# Patient Record
Sex: Female | Born: 1965 | Race: White | Hispanic: No | Marital: Married | Smoking: Never smoker
Health system: Southern US, Community
[De-identification: ages and names within clinical notes are randomized; demographics above are authoritative.]

## PROBLEM LIST (undated history)

## (undated) DIAGNOSIS — Z8719 Personal history of other diseases of the digestive system: Secondary | ICD-10-CM

## (undated) DIAGNOSIS — R519 Headache, unspecified: Secondary | ICD-10-CM

## (undated) DIAGNOSIS — R51 Headache: Secondary | ICD-10-CM

## (undated) DIAGNOSIS — K219 Gastro-esophageal reflux disease without esophagitis: Secondary | ICD-10-CM

## (undated) HISTORY — DX: Headache: R51

## (undated) HISTORY — DX: Headache, unspecified: R51.9

## (undated) HISTORY — PX: BLADDER SUSPENSION: SHX72

## (undated) HISTORY — DX: Gastro-esophageal reflux disease without esophagitis: K21.9

---

## 1997-05-18 HISTORY — PX: EYE SURGERY: SHX253

## 1998-08-26 ENCOUNTER — Other Ambulatory Visit: Admission: RE | Admit: 1998-08-26 | Discharge: 1998-08-26 | Payer: Self-pay | Admitting: Obstetrics and Gynecology

## 1999-03-24 ENCOUNTER — Other Ambulatory Visit: Admission: RE | Admit: 1999-03-24 | Discharge: 1999-03-24 | Payer: Self-pay | Admitting: Obstetrics and Gynecology

## 1999-07-18 ENCOUNTER — Inpatient Hospital Stay (HOSPITAL_COMMUNITY): Admission: AD | Admit: 1999-07-18 | Discharge: 1999-07-18 | Payer: Self-pay | Admitting: Obstetrics and Gynecology

## 1999-10-03 ENCOUNTER — Inpatient Hospital Stay (HOSPITAL_COMMUNITY): Admission: AD | Admit: 1999-10-03 | Discharge: 1999-10-05 | Payer: Self-pay | Admitting: Obstetrics and Gynecology

## 1999-11-11 ENCOUNTER — Other Ambulatory Visit: Admission: RE | Admit: 1999-11-11 | Discharge: 1999-11-11 | Payer: Self-pay | Admitting: Obstetrics and Gynecology

## 2000-12-07 ENCOUNTER — Other Ambulatory Visit: Admission: RE | Admit: 2000-12-07 | Discharge: 2000-12-07 | Payer: Self-pay | Admitting: Obstetrics and Gynecology

## 2001-01-06 ENCOUNTER — Encounter: Admission: RE | Admit: 2001-01-06 | Discharge: 2001-01-06 | Payer: Self-pay | Admitting: Family Medicine

## 2001-01-06 ENCOUNTER — Encounter: Payer: Self-pay | Admitting: Family Medicine

## 2001-12-12 ENCOUNTER — Other Ambulatory Visit: Admission: RE | Admit: 2001-12-12 | Discharge: 2001-12-12 | Payer: Self-pay | Admitting: Obstetrics and Gynecology

## 2002-01-27 ENCOUNTER — Encounter: Payer: Self-pay | Admitting: Urology

## 2002-01-27 ENCOUNTER — Encounter: Admission: RE | Admit: 2002-01-27 | Discharge: 2002-01-27 | Payer: Self-pay | Admitting: Urology

## 2003-01-03 ENCOUNTER — Other Ambulatory Visit: Admission: RE | Admit: 2003-01-03 | Discharge: 2003-01-03 | Payer: Self-pay | Admitting: Obstetrics and Gynecology

## 2003-05-19 HISTORY — PX: TUBAL LIGATION: SHX77

## 2003-07-31 ENCOUNTER — Ambulatory Visit (HOSPITAL_COMMUNITY): Admission: RE | Admit: 2003-07-31 | Discharge: 2003-07-31 | Payer: Self-pay | Admitting: Obstetrics and Gynecology

## 2004-02-08 ENCOUNTER — Other Ambulatory Visit: Admission: RE | Admit: 2004-02-08 | Discharge: 2004-02-08 | Payer: Self-pay | Admitting: Obstetrics and Gynecology

## 2004-12-11 ENCOUNTER — Encounter: Admission: RE | Admit: 2004-12-11 | Discharge: 2004-12-11 | Payer: Self-pay | Admitting: Family Medicine

## 2005-02-20 ENCOUNTER — Other Ambulatory Visit: Admission: RE | Admit: 2005-02-20 | Discharge: 2005-02-20 | Payer: Self-pay | Admitting: Obstetrics and Gynecology

## 2007-09-30 ENCOUNTER — Encounter: Admission: RE | Admit: 2007-09-30 | Discharge: 2007-09-30 | Payer: Self-pay | Admitting: Obstetrics and Gynecology

## 2008-02-24 ENCOUNTER — Encounter: Admission: RE | Admit: 2008-02-24 | Discharge: 2008-02-24 | Payer: Self-pay | Admitting: Family Medicine

## 2009-04-30 ENCOUNTER — Ambulatory Visit (HOSPITAL_BASED_OUTPATIENT_CLINIC_OR_DEPARTMENT_OTHER): Admission: RE | Admit: 2009-04-30 | Discharge: 2009-04-30 | Payer: Self-pay | Admitting: Urology

## 2010-05-18 HISTORY — PX: BREAST LUMPECTOMY: SHX2

## 2010-06-08 ENCOUNTER — Encounter: Payer: Self-pay | Admitting: Obstetrics and Gynecology

## 2010-08-19 LAB — POCT HEMOGLOBIN-HEMACUE: Hemoglobin: 12.5 g/dL (ref 12.0–15.0)

## 2010-10-03 NOTE — H&P (Signed)
NAME:  Deanna Carter, Deanna Carter                      ACCOUNT NO.:  [id number]   MEDICAL RECORD NO.:  [id number]                   PATIENT TYPE:  AMB   LOCATION:  DAY                                  FACILITY:  [facility]   PHYSICIAN:  Juluis Mire, M.D.                DATE OF BIRTH:  Feb 07, 1966   DATE OF ADMISSION:  07/31/2003  DATE OF DISCHARGE:                                HISTORY & PHYSICAL   HISTORY OF PRESENT ILLNESS:  The patient is a 45 year old gravida 3, para 2,  abortus 1, married white female who presents for permanent sterilization.  Also under evaluation by Dr. Logan Bores for stress incontinence.   In relation to the present admission, the patient is presently on birth  control pills in the form of ________.  She is desirous of permanent  sterilization.  The potential irreversibility of sterilization is explained.  Failure rate of 1 in 200 is quoted.  Failures can be in the form of ectopic  pregnancy requiring further surgical management.  The patient voiced  understanding of alternatives, still desires permanent sterilization.  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  We will have laser stand-by available.   ALLERGIES:  No known drug allergies.   MEDICATIONS:  1. Birth control pills in the form of ______________.  2. Meclizine.   PAST MEDICAL HISTORY:  Usual childhood diseases with no significant sequela.   PAST SURGICAL HISTORY:  The noted previous diagnostic laparoscopy with laser  stand-by in 1988.   OBSTETRICAL HISTORY:  She has had two vaginal deliveries, one TAB.   FAMILY HISTORY:  Noncontributory.   SOCIAL HISTORY:  No tobacco or alcohol use.   REVIEW OF SYSTEMS:  Noncontributory.   PHYSICAL EXAMINATION:  VITAL SIGNS:  The patient is afebrile with stable  vital signs.  HEENT:  The patient is normocephalic.  Pupils equal, round, reactive to  light and accommodation.  Extraocular movements were intact.  Sclerae and  conjunctivae clear.  Oropharynx clear.  NECK:  Without thyromegaly.  BREASTS:  No discrete masses.  LUNGS:  Clear.  HEART:  Regular rate and rhythm without murmurs or gallops.  ABDOMEN:  Benign.  No masses, organomegaly, or tenderness.  PELVIC:  Normal external genitalia.  Vaginal mucosa is clear.  Cervix is  unremarkable.  Uterus is normal size, shape, and contour.  Adnexa free of  masses or tenderness.  EXTREMITIES:  Trace edema.  NEUROLOGIC:  Grossly within normal limits.   IMPRESSION:  Multiparity, desires sterility.   PLAN:  The patient will undergo diagnostic laparoscopy with laser stand-by.  We will do a bilateral tubal fulguration.  The risks of surgery have been  discussed, including the risk of infection.  The risk of hemorrhage that  could require transfusion with associated risks of AIDS or hepatitis.  The  risk of injury to adjacent organs, including bowel, bladder, ureters, that  could require further exploratory surgery.  The risk of deep vein thrombosis  and pulmonary embolism.  The patient voiced understanding of the indications  and risks.                                               Juluis Mire, M.D.    JSM/MEDQ  D:  07/31/2003  T:  07/31/2003  Job:  [id number]

## 2010-10-03 NOTE — Op Note (Signed)
NAME:  Deanna Carter, Deanna Carter                      ACCOUNT NO.:  [id number]   MEDICAL RECORD NO.:  [id number]                   PATIENT TYPE:  AMB   LOCATION:  DAY                                  FACILITY:  [facility]   PHYSICIAN:  Juluis Mire, M.D.                DATE OF BIRTH:  November 05, 1965   DATE OF PROCEDURE:  07/31/2003  DATE OF DISCHARGE:                                 OPERATIVE REPORT   PREOPERATIVE DIAGNOSES:  1. Multiparity, desires sterility.  2. History of endometriosis.   POSTOPERATIVE DIAGNOSES:  1. Multiparity, desires sterility.  2. History of endometriosis.  3. No evidence of active endometriosis.  4. Some evidence of pelvic congestion.   PROCEDURE:  Diagnostic laparoscopic with bilateral tubal fulguration.   SURGEON:  Juluis Mire, M.D.   ANESTHESIA:  General endotracheal.   ESTIMATED BLOOD LOSS:  Minimal.   PACKS AND DRAINS:  None.   INTRAOPERATIVE BLOOD REPLACEMENT:  None.   COMPLICATIONS:  None.   INDICATIONS:  Dictated in history and physical.   DESCRIPTION OF PROCEDURE:  The patient was taken to the OR and placed in  supine position.  After satisfactory level of general endotracheal  anesthesia was obtained, the patient was placed in the dorsal lithotomy  position using the Allen stirrups.  PAS stockings were in place.  The  abdomen, perineum, and vagina were prepped out with Betadine.  Bladder was  emptied by catheterization.  Exam under anesthesia revealed a mild  cystocele; uterus normal size, shape, and contour; adnexa unremarkable.  A  Hulka tenaculum was put in place and secured.  The patient was draped out as  a sterile field.  Subumbilical incision was made with the knife.  The Veress  needle was introduced in the abdominal cavity.  The abdomen was inflated  with approximately 3 liters of carbon dioxide.  The operating laparoscope  was introduced.  There was no evidence of injury to adjacent organs.  Upper  abdomen, including the liver and  tip of the gallbladder, were clear.  We  could visualize the cecum.  But despite manipulation, we could not see the  appendix, felt that it was probably retrocecal.  There were no adhesions or  signs of concerns.  Uterus was of normal size and shape.  Tubes and ovaries  were unremarkable.  She had large venous congestion on the sides of the  uterus in the right ovarian area.  But, again, no signs of endometriosis or  adhesions.  Both tubes were identified by following out to fimbriated end.  Bipolar was used to cauterize a 2-3 cm segment of tube.  Cauterization was  continued until resistance read zero on the voltage meter.  We then re-  cauterized the same segment to the tube, completely desiccating the tube.  Both tubes were adequately coagulated.  At the end of the procedure, there  were no signs of injury to adjacent organs or active  bleeding.  Both tubes  were adequately cauterized.  The abdomen was deflated with carbon dioxide.  The laparoscope and trocar was removed.  The subumbilical incision was  closed with interrupted subcuticulars of 4-0 Vicryl.  The Hulka tenaculum  was then removed.  Sponge and instrument count reported as correct by  circulating nurse.  Dr. Logan Bores took over at this point in time to complete  the operative procedure.                                               Juluis Mire, M.D.    JSM/MEDQ  D:  07/31/2003  T:  07/31/2003  Job:  [id number]

## 2010-10-03 NOTE — Op Note (Signed)
NAME:  Deanna Carter, Deanna Carter                      ACCOUNT NO.:  [id number]   MEDICAL RECORD NO.:  [id number]                   PATIENT TYPE:  AMB   LOCATION:  DAY                                  FACILITY:  [facility]   PHYSICIAN:  Jamison Neighbor, M.D.               DATE OF BIRTH:  1965-11-15   DATE OF PROCEDURE:  07/31/2003  DATE OF DISCHARGE:                                 OPERATIVE REPORT   PREOPERATIVE DIAGNOSES:  1. Stress urinary incontinence.  2. Cystocele.   POSTOPERATIVE DIAGNOSES:  1. Stress urinary incontinence.  2. Cystocele.   PROCEDURE:  1. Cystoscopy.  2. Anterior repair of cystocele.  3. Pubovaginal sling.   SURGEON:  Jamison Neighbor, M.D.   ANESTHESIA:  General.   COMPLICATIONS:  None.   DRAINS:  A 16 French Foley catheter.   BRIEF HISTORY:  This 45 year old female has classic stress urinary  incontinence.  She has loss of urine whenever she does physical activity  such as jumping, __________, dancing, etc.  The patient is scheduled to have  a tubal ligation and was interested in having a sling procedure done at the  same time.  At the time of her initial evaluation, we did note that she had  a cystocele and a somewhat elevated postvoid residual, and it was thought  that the cystocele may need to be reduced at the time of surgery.  However,  we did think that we could possibly treat this with a simple sling.  The  patient is to undergo the tubal ligation with Dr. Arelia Sneddon and then will  undergo sling procedure with possible cystocele depending on the  intraoperative findings.  The patient understands the risks and benefits of  the procedure and gave full informed consent.   DESCRIPTION OF PROCEDURE:  After successful induction of general anesthesia,  the patient was placed in the dorsal lithotomy position, prepped with  Betadine, and draped in the usual sterile fashion.  The patient underwent a  tubal ligation by Dr. Arelia Sneddon with fulguration of the fallopian  tubes  bilaterally.  The patient was then re-prepped and draped for the sling  procedure.   The weighted speculum was placed, and the area was inspected.  The patient  did not have much in the way of a rectocele, and her perineum seemed to be  intact, but she did have larger cystocele that had been appreciated  preoperatively.  There was some degree of uterine prolapse but certainly not  enough in Dr. Lisbeth Ply opinion to require hysterectomy.  The decision was  made to perform the sling as well as to do a few simple plications such as  to somewhat reduce the cystocele and improve her ability to empty her  bladder postoperatively.  The area was infiltrated with local anesthesia,  and an incision was made, beginning at the mid urethra extending back  towards the cardinal ligaments.  The flaps of mucosa were  raised  bilaterally, hemostasis obtained with electrocautery.  Entry into the space  of Retzius was performed at the major urethral level for placement of the  sling.  The patient appeared to primarily have a central defect without a  lot of lateral defect, and it was felt that mesh repair was not required.  The patient had the cystocele reduced with several mattress sutures of 2-0  Vicryl which did nicely elevated and correct the central defect.  The  patient had two incisions made 3-4 cm lateral to the clitoris at the groin  crease, and the needle was then passed through the obturator and into the  retroperitoneal space.  The sling was then positioned directly onto the  urethra, and the tension was set appropriately.  Cystoscopy was performed,  first with the 70-degree lens and then with the 12-degree lens.  This was  done to ensure that there was no injury to the bladder, and it was clear  that the bladder had not been entered in any way.  In addition, the vaginal  area was carefully inspected to make sure that the needle had not passed  through the vaginal mucosa which was intact.   The vaginal mucosa was then  trimmed slightly and was closed with a running suture of 3-0 Vicryl.  The  patient had had the sling tension set through both inspection as well as by  filling the bladder and performing a Valsalva maneuver to ensure that she  had a prompt straight flow of urine.  The cystoscope entered directly, and  there was no excessive angulation.  The patient had packing applied.  The  Foley catheter was left in place.  The two small stab incisions in the groin  crease were closed with Dermabond.  The patient tolerated the procedure well  and was taken to the recovery room in good condition.  She originally was  set to have this done as an outpatient, but she will be given the option of  23 hour observation if she prefers because she did have the additional  cystocele repair.  She also may have the option of going home with a  catheter if unable to urinate.  The patient will be given a prescription for  Lorcet 10 as well as Keflex and return to the office in follow-up.                                               Jamison Neighbor, M.D.    RJE/MEDQ  D:  07/31/2003  T:  07/31/2003  Job:  [id number]   cc:   Juluis Mire, M.D.  [address] [place]  [place] [address]  Fax: [phone]   Quita Skye. Artis Flock, M.D.  [address], [address]  [place]  [place] [address]  Fax: [phone]

## 2010-10-29 ENCOUNTER — Other Ambulatory Visit: Payer: Self-pay | Admitting: Otolaryngology

## 2010-10-29 DIAGNOSIS — H938X9 Other specified disorders of ear, unspecified ear: Secondary | ICD-10-CM

## 2010-10-29 DIAGNOSIS — R42 Dizziness and giddiness: Secondary | ICD-10-CM

## 2010-11-03 ENCOUNTER — Ambulatory Visit
Admission: RE | Admit: 2010-11-03 | Discharge: 2010-11-03 | Disposition: A | Discharge status: Home / Self Care | Payer: BC Managed Care – PPO | Source: Ambulatory Visit | Attending: Otolaryngology | Admitting: Otolaryngology

## 2010-11-03 DIAGNOSIS — H938X9 Other specified disorders of ear, unspecified ear: Secondary | ICD-10-CM

## 2010-11-03 DIAGNOSIS — R42 Dizziness and giddiness: Secondary | ICD-10-CM

## 2010-11-03 MED ORDER — GADOBENATE DIMEGLUMINE 529 MG/ML IV SOLN
14.0000 mL | Freq: Once | INTRAVENOUS | Status: AC | PRN
  Administered 2010-11-03: 14 mL via INTRAVENOUS

## 2011-02-09 ENCOUNTER — Other Ambulatory Visit: Payer: Self-pay | Admitting: Obstetrics and Gynecology

## 2011-02-09 DIAGNOSIS — N63 Unspecified lump in unspecified breast: Secondary | ICD-10-CM

## 2011-02-17 ENCOUNTER — Other Ambulatory Visit: Payer: Self-pay | Admitting: Obstetrics and Gynecology

## 2011-02-17 ENCOUNTER — Ambulatory Visit
Admission: RE | Admit: 2011-02-17 | Discharge: 2011-02-17 | Disposition: A | Discharge status: Home / Self Care | Payer: BC Managed Care – PPO | Source: Ambulatory Visit | Attending: Obstetrics and Gynecology | Admitting: Obstetrics and Gynecology

## 2011-02-17 DIAGNOSIS — N63 Unspecified lump in unspecified breast: Secondary | ICD-10-CM

## 2011-03-02 ENCOUNTER — Ambulatory Visit (INDEPENDENT_AMBULATORY_CARE_PROVIDER_SITE_OTHER): Payer: BC Managed Care – PPO | Admitting: Surgery

## 2011-03-02 ENCOUNTER — Encounter (INDEPENDENT_AMBULATORY_CARE_PROVIDER_SITE_OTHER): Payer: Self-pay | Admitting: Surgery

## 2011-03-02 VITALS — BP 124/90 | HR 62 | Temp 98.0°F | Resp 14 | Ht 67.5 in | Wt 157.2 lb

## 2011-03-02 DIAGNOSIS — N63 Unspecified lump in unspecified breast: Secondary | ICD-10-CM

## 2011-03-02 DIAGNOSIS — N6310 Unspecified lump in the right breast, unspecified quadrant: Secondary | ICD-10-CM

## 2011-03-02 NOTE — Progress Notes (Signed)
Chief Complaint  Patient presents with  . 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 female.   HPI Patient referred by Dr. Guinevere Ferrari for evaluation of a right breast mass. The patient palpated the mass herself. She then had mammograms and ultrasound demonstrating a right breast mass. She is here after core biopsy of the mass. She has no complaints regarding her breast. She denies nipple discharge. Her family history is negative for breast cancer Past Medical History  Diagnosis Date  . GERD (gastroesophageal reflux disease)   . Generalized headaches     tension - only takes medication as needed to treat    Past Surgical History  Procedure Date  . Bladder suspension 2009  . Tubal ligation 2005    History reviewed. No pertinent family history.  Social History History  Substance Use Topics  . Smoking status: Never Smoker   . Smokeless tobacco: Never Used  . Alcohol Use: No    No Known Allergies  Current Outpatient Prescriptions  Medication Sig Dispense Refill  . Hydrocodone-Guaifenesin (NARCOF PO) Take 325 mg by mouth as needed.        Lorita Officer Triphasic (ORTHO TRI-CYCLEN, 28, PO) Take by mouth daily. Pt taking the generic version of this medication.       Marland Kitchen zolpidem (AMBIEN) 10 MG tablet Take 10 mg by mouth as needed.          Review of Systems Review of Systems  Constitutional: Negative.   HENT: Negative.   Eyes: Negative.   Cardiovascular: Negative.   Gastrointestinal: Negative.   Genitourinary: Negative.   Musculoskeletal: Negative.   Neurological: Negative.   Hematological: Negative.   Psychiatric/Behavioral: Negative.     Blood pressure 124/90, pulse 62, temperature 98 F (36.7 C), temperature source Temporal, resp. rate 14, height 5' 7.5" (1.715 m), weight 157 lb 3.2 oz (71.305 kg).  Physical Exam Physical Exam  Constitutional: She is oriented to person, place, and time. She appears well-developed and  well-nourished. No distress.  HENT:  Head: Normocephalic and atraumatic.  Right Ear: External ear normal.  Left Ear: External ear normal.  Nose: Nose normal.  Mouth/Throat: Oropharynx is clear and moist. No oropharyngeal exudate.  Eyes: Conjunctivae are normal. Pupils are equal, round, and reactive to light. No scleral icterus.  Neck: Normal range of motion. Neck supple. No JVD present. No tracheal deviation present. No thyromegaly present.  Cardiovascular: Normal rate, regular rhythm and normal heart sounds.   No murmur heard. Pulmonary/Chest: Effort normal and breath sounds normal. No respiratory distress. She has no wheezes.  Abdominal: Soft. Bowel sounds are normal. There is no tenderness.  Musculoskeletal: Normal range of motion. She exhibits no edema and no tenderness.  Lymphadenopathy:    She has no cervical adenopathy.  Neurological: She is alert and oriented to person, place, and time.  Skin: Skin is warm and dry. No rash noted. She is not diaphoretic. No erythema.  Psychiatric: Her behavior is normal. Judgment normal.  On breast exam, there is ecchymosis of the right breast and the biopsy site. There is so much swelling and hematoma that I cannot palpate a breast mass. There is no right axillary adenopathy  Data Reviewed I have affixed mammogram and ultrasound of the breast which I reviewed. 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 As I cannot palpate a mass, needle localization would be needed for needle localized lumpectomy. I discussed the risks of surgery with her including the risk of bleeding infection injury, and a chest finding malignancy would need for further surgery. She understands and wished to proceed. She will call to schedule surgery. I told her that it was very likely that the mass be removed and  hopefully findings will be benign       [name] A 03/02/2011, 2:17 PM

## 2011-03-06 ENCOUNTER — Other Ambulatory Visit (INDEPENDENT_AMBULATORY_CARE_PROVIDER_SITE_OTHER): Payer: Self-pay | Admitting: Surgery

## 2011-03-06 DIAGNOSIS — N63 Unspecified lump in unspecified breast: Secondary | ICD-10-CM

## 2011-03-18 ENCOUNTER — Encounter (HOSPITAL_COMMUNITY): Payer: Self-pay | Admitting: Pharmacist

## 2011-03-19 ENCOUNTER — Encounter (HOSPITAL_COMMUNITY): Payer: Self-pay | Admitting: Pharmacy Technician

## 2011-03-20 ENCOUNTER — Encounter (HOSPITAL_COMMUNITY)
Admission: RE | Admit: 2011-03-20 | Discharge: 2011-03-20 | Disposition: A | Discharge status: Home / Self Care | Payer: BC Managed Care – PPO | Source: Ambulatory Visit | Attending: Surgery | Admitting: Surgery

## 2011-03-20 ENCOUNTER — Encounter (HOSPITAL_COMMUNITY): Payer: Self-pay

## 2011-03-20 LAB — CBC
MCHC: 34.2 g/dL (ref 30.0–36.0)
RDW: 12.4 % (ref 11.5–15.5)

## 2011-03-20 LAB — SURGICAL PCR SCREEN
MRSA, PCR: NEGATIVE
Staphylococcus aureus: NEGATIVE

## 2011-03-20 LAB — HCG, SERUM, QUALITATIVE: Preg, Serum: NEGATIVE

## 2011-03-20 NOTE — Pre-Procedure Instructions (Signed)
20 Deanna Carter  03/20/2011   Your procedure is scheduled on:  November 8  Report to [facility] at 7:30 AM.  Call this number if you have problems the morning of surgery: [phone]   Remember:   Do not eat food:After Midnight.  Do not drink clear liquids: 4 Hours before arrival.  Take these medicines the morning of surgery with A SIP OF WATER: Tylenol if needed, hydrocodone if needed, eye drops, birth control, omeprazole   Do not wear jewelry, make-up or nail polish.  Do not wear lotions, powders, or perfumes. You may wear deodorant.  Do not shave 48 hours prior to surgery.  Do not bring valuables to the hospital.  Contacts, dentures or bridgework may not be worn into surgery.  Leave suitcase in the car. After surgery it may be brought to your room.  For patients admitted to the hospital, checkout time is 11:00 AM the day of discharge.   Patients discharged the day of surgery will not be allowed to drive home.  Name and phone number of your driver: Genevie Cheshire [phone]  Special Instructions: CHG Shower Use Special Wash: 1/2 bottle night before surgery and 1/2 bottle morning of surgery.   Please read over the following fact sheets that you were given: Pain Booklet, Coughing and Deep Breathing and Surgical Site Infection Prevention

## 2011-03-25 MED ORDER — CEFAZOLIN SODIUM-DEXTROSE 2-3 GM-% IV SOLR
2.0000 g | INTRAVENOUS | Status: DC
  Filled 2011-03-25: qty 50

## 2011-03-26 ENCOUNTER — Ambulatory Visit (HOSPITAL_COMMUNITY): Payer: BC Managed Care – PPO | Admitting: Anesthesiology

## 2011-03-26 ENCOUNTER — Encounter (HOSPITAL_COMMUNITY)
Admission: RE | Disposition: A | Discharge status: Home / Self Care | Payer: Self-pay | Source: Ambulatory Visit | Attending: Surgery

## 2011-03-26 ENCOUNTER — Encounter (HOSPITAL_COMMUNITY): Payer: Self-pay | Admitting: Anesthesiology

## 2011-03-26 ENCOUNTER — Ambulatory Visit
Admission: RE | Admit: 2011-03-26 | Discharge: 2011-03-26 | Disposition: A | Discharge status: Home / Self Care | Payer: BC Managed Care – PPO | Source: Ambulatory Visit | Attending: Surgery | Admitting: Surgery

## 2011-03-26 ENCOUNTER — Other Ambulatory Visit (INDEPENDENT_AMBULATORY_CARE_PROVIDER_SITE_OTHER): Payer: Self-pay | Admitting: Surgery

## 2011-03-26 ENCOUNTER — Ambulatory Visit (HOSPITAL_COMMUNITY)
Admission: RE | Admit: 2011-03-26 | Discharge: 2011-03-26 | Disposition: A | Discharge status: Home / Self Care | Payer: BC Managed Care – PPO | Source: Ambulatory Visit | Attending: Surgery | Admitting: Surgery

## 2011-03-26 DIAGNOSIS — Z01812 Encounter for preprocedural laboratory examination: Secondary | ICD-10-CM | POA: Insufficient documentation

## 2011-03-26 DIAGNOSIS — D249 Benign neoplasm of unspecified breast: Secondary | ICD-10-CM

## 2011-03-26 DIAGNOSIS — N63 Unspecified lump in unspecified breast: Secondary | ICD-10-CM

## 2011-03-26 DIAGNOSIS — R51 Headache: Secondary | ICD-10-CM | POA: Insufficient documentation

## 2011-03-26 DIAGNOSIS — N6089 Other benign mammary dysplasias of unspecified breast: Secondary | ICD-10-CM | POA: Insufficient documentation

## 2011-03-26 DIAGNOSIS — N6039 Fibrosclerosis of unspecified breast: Secondary | ICD-10-CM | POA: Insufficient documentation

## 2011-03-26 DIAGNOSIS — K219 Gastro-esophageal reflux disease without esophagitis: Secondary | ICD-10-CM | POA: Insufficient documentation

## 2011-03-26 DIAGNOSIS — N6310 Unspecified lump in the right breast, unspecified quadrant: Secondary | ICD-10-CM | POA: Insufficient documentation

## 2011-03-26 HISTORY — PX: MASTECTOMY, PARTIAL: SHX709

## 2011-03-26 HISTORY — PX: BREAST SURGERY: SHX581

## 2011-03-26 SURGERY — MASTECTOMY PARTIAL
Anesthesia: Monitor Anesthesia Care | Site: Breast | Laterality: Right | Wound class: Clean

## 2011-03-26 MED ORDER — FENTANYL CITRATE 0.05 MG/ML IJ SOLN
INTRAMUSCULAR | Status: DC | PRN
  Administered 2011-03-26 (×2): 50 ug via INTRAVENOUS

## 2011-03-26 MED ORDER — HYDROCODONE-ACETAMINOPHEN 5-325 MG PO TABS: 1.0000 | ORAL_TABLET | ORAL | Status: AC | PRN

## 2011-03-26 MED ORDER — SODIUM CHLORIDE 0.9 % IR SOLN
Status: DC | PRN
  Administered 2011-03-26: 1

## 2011-03-26 MED ORDER — MEPERIDINE HCL 25 MG/ML IJ SOLN: 6.2500 mg | INTRAMUSCULAR | Status: DC | PRN

## 2011-03-26 MED ORDER — KETOROLAC TROMETHAMINE 30 MG/ML IJ SOLN
INTRAMUSCULAR | Status: DC | PRN
  Administered 2011-03-26: 30 mg via INTRAVENOUS

## 2011-03-26 MED ORDER — CEFAZOLIN SODIUM 1-5 GM-% IV SOLN
INTRAVENOUS | Status: DC | PRN
  Administered 2011-03-26: 2 g via INTRAVENOUS

## 2011-03-26 MED ORDER — MIDAZOLAM HCL 2 MG/2ML IJ SOLN: 0.5000 mg | Freq: Once | INTRAMUSCULAR | Status: DC | PRN

## 2011-03-26 MED ORDER — OXYCODONE HCL 5 MG PO TABS: 5.0000 mg | ORAL_TABLET | ORAL | Status: DC | PRN

## 2011-03-26 MED ORDER — ONDANSETRON HCL 4 MG/2ML IJ SOLN
INTRAMUSCULAR | Status: DC | PRN
  Administered 2011-03-26: 4 mg via INTRAVENOUS

## 2011-03-26 MED ORDER — LACTATED RINGERS IV SOLN
INTRAVENOUS | Status: DC
  Administered 2011-03-26: 09:00:00 via INTRAVENOUS

## 2011-03-26 MED ORDER — PROMETHAZINE HCL 25 MG/ML IJ SOLN: 12.5000 mg | Freq: Four times a day (QID) | INTRAMUSCULAR | Status: DC | PRN

## 2011-03-26 MED ORDER — HYDROMORPHONE HCL PF 1 MG/ML IJ SOLN: 0.2500 mg | INTRAMUSCULAR | Status: DC | PRN

## 2011-03-26 MED ORDER — MORPHINE SULFATE 2 MG/ML IJ SOLN: 0.0500 mg/kg | INTRAMUSCULAR | Status: DC | PRN

## 2011-03-26 MED ORDER — MIDAZOLAM HCL 5 MG/5ML IJ SOLN
INTRAMUSCULAR | Status: DC | PRN
  Administered 2011-03-26: 2 mg via INTRAVENOUS

## 2011-03-26 MED ORDER — LACTATED RINGERS IV SOLN
INTRAVENOUS | Status: DC | PRN
  Administered 2011-03-26: 09:00:00 via INTRAVENOUS

## 2011-03-26 MED ORDER — SCOPOLAMINE 1 MG/3DAYS TD PT72
MEDICATED_PATCH | TRANSDERMAL | Status: DC | PRN
  Administered 2011-03-26: 1.5 mg via TRANSDERMAL

## 2011-03-26 MED ORDER — PROPOFOL 10 MG/ML IV EMUL
INTRAVENOUS | Status: DC | PRN
  Administered 2011-03-26: 160 mg via INTRAVENOUS

## 2011-03-26 MED ORDER — PROMETHAZINE HCL 25 MG/ML IJ SOLN: 6.2500 mg | INTRAMUSCULAR | Status: DC | PRN

## 2011-03-26 MED ORDER — DEXAMETHASONE SODIUM PHOSPHATE 4 MG/ML IJ SOLN
INTRAMUSCULAR | Status: DC | PRN
  Administered 2011-03-26: 4 mg via INTRAVENOUS

## 2011-03-26 MED ORDER — BUPIVACAINE-EPINEPHRINE 0.25% -1:200000 IJ SOLN
INTRAMUSCULAR | Status: DC | PRN
  Administered 2011-03-26: 10 mL

## 2011-03-26 SURGICAL SUPPLY — 40 items
ADH SKN CLS APL DERMABOND .7 (GAUZE/BANDAGES/DRESSINGS) ×1
BLADE SURG 15 STRL LF DISP TIS (BLADE) ×1 IMPLANT
BLADE SURG 15 STRL SS (BLADE) ×2
CANISTER SUCTION 2500CC (MISCELLANEOUS) ×1 IMPLANT
CHLORAPREP W/TINT 26ML (MISCELLANEOUS) ×2 IMPLANT
CLOTH BEACON ORANGE TIMEOUT ST (SAFETY) ×2 IMPLANT
COVER SURGICAL LIGHT HANDLE (MISCELLANEOUS) ×2 IMPLANT
DECANTER SPIKE VIAL GLASS SM (MISCELLANEOUS) ×2 IMPLANT
DERMABOND ADVANCED (GAUZE/BANDAGES/DRESSINGS) ×1
DERMABOND ADVANCED .7 DNX12 (GAUZE/BANDAGES/DRESSINGS) ×1 IMPLANT
DRAPE PED LAPAROTOMY (DRAPES) ×2 IMPLANT
ELECT CAUTERY BLADE 6.4 (BLADE) ×2 IMPLANT
ELECT REM PT RETURN 9FT ADLT (ELECTROSURGICAL) ×2
ELECTRODE REM PT RTRN 9FT ADLT (ELECTROSURGICAL) ×1 IMPLANT
GAUZE SPONGE 4X4 16PLY XRAY LF (GAUZE/BANDAGES/DRESSINGS) ×2 IMPLANT
GLOVE BIOGEL PI IND STRL 7.0 (GLOVE) IMPLANT
GLOVE BIOGEL PI INDICATOR 7.0 (GLOVE) ×1
GLOVE SURG SIGNA 7.5 PF LTX (GLOVE) ×2 IMPLANT
GLOVE SURG SS PI 6.5 STRL IVOR (GLOVE) ×1 IMPLANT
GOWN BRE IMP SLV AUR LG STRL (GOWN DISPOSABLE) ×1 IMPLANT
GOWN PREVENTION PLUS XLARGE (GOWN DISPOSABLE) ×4 IMPLANT
GOWN STRL NON-REIN LRG LVL3 (GOWN DISPOSABLE) ×2 IMPLANT
KIT BASIN OR (CUSTOM PROCEDURE TRAY) ×2 IMPLANT
KIT ROOM TURNOVER OR (KITS) ×2 IMPLANT
NDL HYPO 25GX1X1/2 BEV (NEEDLE) ×1 IMPLANT
NEEDLE HYPO 25GX1X1/2 BEV (NEEDLE) ×2 IMPLANT
NS IRRIG 1000ML POUR BTL (IV SOLUTION) ×2 IMPLANT
PACK SURGICAL SETUP 50X90 (CUSTOM PROCEDURE TRAY) ×2 IMPLANT
PAD ARMBOARD 7.5X6 YLW CONV (MISCELLANEOUS) ×2 IMPLANT
PENCIL BUTTON HOLSTER BLD 10FT (ELECTRODE) ×2 IMPLANT
SUT MNCRL AB 4-0 PS2 18 (SUTURE) ×2 IMPLANT
SUT VIC AB 3-0 SH 27 (SUTURE) ×2
SUT VIC AB 3-0 SH 27X BRD (SUTURE) ×1 IMPLANT
SYR BULB 3OZ (MISCELLANEOUS) ×2 IMPLANT
SYR CONTROL 10ML LL (SYRINGE) ×2 IMPLANT
TOWEL OR 17X24 6PK STRL BLUE (TOWEL DISPOSABLE) ×2 IMPLANT
TOWEL OR 17X26 10 PK STRL BLUE (TOWEL DISPOSABLE) ×2 IMPLANT
TUBE CONNECTING 12X1/4 (SUCTIONS) ×1 IMPLANT
WATER STERILE IRR 1000ML POUR (IV SOLUTION) IMPLANT
YANKAUER SUCT BULB TIP NO VENT (SUCTIONS) ×1 IMPLANT

## 2011-03-26 NOTE — Op Note (Signed)
03/26/2011  10:11 AM  PATIENT:  Deanna Carter  45 y.o. female  PRE-OPERATIVE DIAGNOSIS:  RIGHT BREAST MASS  POST-OPERATIVE DIAGNOSIS:  right breast mass   PROCEDURE:  Procedure(s): MASTECTOMY PARTIAL  SURGEON:  Surgeon(s): Shelly Rubenstein, MD  PHYSICIAN ASSISTANT:   ASSISTANTS: none   ANESTHESIA:   local and general  EBL:     BLOOD ADMINISTERED:none  DRAINS: none   LOCAL MEDICATIONS USED:  MARCAINE 10CC  SPECIMEN:  Excision  DISPOSITION OF SPECIMEN:  PATHOLOGY  COUNTS:  YES  TOURNIQUET:  * No tourniquets in log *  DICTATION: .Dragon Dictation The patient was brought to the operating room and identified as the correct patient. She was placed supine on the operating room table and general anesthesia was induced. Her right breast was then prepped and draped in the usual sterile fashion. A localization wire had already been placed into the right breast. I dissected the skin at the lower edge of the areola Marcaine.  I then made a incision with a scalpel. I took this down into the breast tissue with the cautery. I then incorporated the localization wire into the incision. I then performed a wide excisional biopsy of the breast tissue surrounding the wire. Once the specimen was completely removed, x-ray confirmed that the suspicious specimen and clip were in the removed biopsy specimen. I then achieved hemostasis with cautery. The wound is irrigated with saline. The subcutaneous tissue was closed with 3-0 Vicryl sutures. The skin was closed with a running 4-0 Monocryl suture. Dermabond was then used. Gauze and tape were applied. The patient tolerated procedure well. All counts were correct at the end of the procedure. The patient was extubated in the operating room and taken in stable condition to the recovery area.   PLAN OF CARE: Discharge to home after PACU  PATIENT DISPOSITION:  PACU - hemodynamically stable.   Delay start of Pharmacological VTE agent (>24hrs) due  to surgical blood loss or risk of bleeding:  {YES/NO/NOT APPLICABLE:20182

## 2011-03-26 NOTE — Anesthesia Procedure Notes (Signed)
Procedure Name: LMA Insertion Date/Time: 03/26/2011 9:43 AM Performed by: Leona Singleton A. Oxygen Delivery Method: Circle System Utilized Preoxygenation: Pre-oxygenation with 100% oxygen Intubation Type: IV induction LMA: LMA inserted LMA Size: 3.0 Tube type: Oral Number of attempts: 2 Placement Confirmation: positive ETCO2 and breath sounds checked- equal and bilateral Tube secured with: Tape Dental Injury: Teeth and Oropharynx as per pre-operative assessment

## 2011-03-26 NOTE — Interval H&P Note (Signed)
History and Physical Interval Note:   03/26/2011   8:22 AM   Deanna Carter  has presented today for surgery, with the diagnosis of RIGHT BREAST MASS  The various methods of treatment have been discussed with the patient and family. After consideration of risks, benefits and other options for treatment, the patient has consented to  Procedure(s): MASTECTOMY PARTIAL as a surgical intervention .  The patients' history has been reviewed, patient examined, no change in status, stable for surgery.  I have reviewed the patients' chart and labs.  Questions were answered to the patient's satisfaction.     Abigail Miyamoto A  MD

## 2011-03-26 NOTE — Anesthesia Preprocedure Evaluation (Addendum)
Anesthesia Evaluation  Patient identified by MRN, date of birth, ID band Patient awake    Reviewed: Allergy & Precautions, H&P , NPO status , Patient's Chart, lab work & pertinent test results  History of Anesthesia Complications (+) PONV  Airway Mallampati: II TM Distance: >3 FB Neck ROM: full    Dental  (+) Teeth Intact and Dental Advidsory Given   Pulmonary neg pulmonary ROS,  clear to auscultation  Pulmonary exam normal       Cardiovascular neg cardio ROS regular Normal    Neuro/Psych  Headaches,    GI/Hepatic Neg liver ROS, GERD-  Medicated and Controlled,  Endo/Other  Negative Endocrine ROS  Renal/GU negative Renal ROS  Genitourinary negative   Musculoskeletal   Abdominal   Peds  Hematology negative hematology ROS (+)   Anesthesia Other Findings   Reproductive/Obstetrics negative OB ROS                          Anesthesia Physical Anesthesia Plan  ASA: II  Anesthesia Plan: General   Post-op Pain Management:    Induction: Intravenous  Airway Management Planned: LMA  Additional Equipment:   Intra-op Plan:   Post-operative Plan:   Informed Consent: I have reviewed the patients History and Physical, chart, labs and discussed the procedure including the risks, benefits and alternatives for the proposed anesthesia with the patient or authorized representative who has indicated his/her understanding and acceptance.     Plan Discussed with: CRNA  Anesthesia Plan Comments:        Anesthesia Quick Evaluation

## 2011-03-26 NOTE — Anesthesia Postprocedure Evaluation (Signed)
  Anesthesia Post-op Note  Patient: Deanna Carter  Procedure(s) Performed:  MASTECTOMY PARTIAL - NEEDLE LOCALIZED RIGHT BREAST PARTIAL MASTECTOMY  Patient Location: PACU  Anesthesia Type: General  Level of Consciousness: awake  Airway and Oxygen Therapy: Patient Spontanous Breathing  Post-op Pain: mild  Post-op Assessment: Post-op Vital signs reviewed  Post-op Vital Signs: stable  Complications: No apparent anesthesia complications

## 2011-03-26 NOTE — Transfer of Care (Signed)
Immediate Anesthesia Transfer of Care Note  Patient: Deanna Carter  Procedure(s) Performed:  MASTECTOMY PARTIAL - NEEDLE LOCALIZED RIGHT BREAST PARTIAL MASTECTOMY  Patient Location: PACU  Anesthesia Type: General  Level of Consciousness: awake, sedated and patient cooperative  Airway & Oxygen Therapy: Patient Spontanous Breathing and Patient connected to nasal cannula oxygen  Post-op Assessment: Report given to PACU RN and Post -op Vital signs reviewed and stable  Post vital signs: Reviewed and stable  Complications: No apparent anesthesia complications

## 2011-03-26 NOTE — H&P (Signed)
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 female.   HPI Patient referred by Dr. Guinevere Carter for evaluation of Carter right breast mass. The patient palpated the mass herself. She then had mammograms and ultrasound demonstrating Carter right breast mass. She is here after core biopsy of the mass. She has no complaints regarding her breast. She denies nipple discharge. Her family history is negative for breast cancer Past Medical History   Diagnosis  Date   .  GERD (gastroesophageal reflux disease)     .  Generalized headaches         tension - only takes medication as needed to treat       Past Surgical History   Procedure  Date   .  Bladder suspension  2009   .  Tubal ligation  2005      History reviewed. No pertinent family history.   Social History History   Substance Use Topics   .  Smoking status:  Never Smoker    .  Smokeless tobacco:  Never Used   .  Alcohol Use:  No      No Known Allergies    Current Outpatient Prescriptions   Medication  Sig  Dispense  Refill   .  Hydrocodone-Guaifenesin (NARCOF PO)  Take 325 mg by mouth as needed.           Deanna Carter Triphasic (ORTHO TRI-CYCLEN, 28, PO)  Take by mouth daily. Pt taking the generic version of this medication.          Marland Kitchen   zolpidem (AMBIEN) 10 MG tablet  Take 10 mg by mouth as needed.              Review of Systems Review of Systems  Constitutional: Negative.   HENT: Negative.   Eyes: Negative.   Cardiovascular: Negative.   Gastrointestinal: Negative.   Genitourinary: Negative.   Musculoskeletal: Negative.   Neurological: Negative.   Hematological: Negative.   Psychiatric/Behavioral: Negative.     Blood pressure 124/90, pulse 62, temperature 98 F (36.7 C), temperature source Temporal, resp. rate 14, height 5' 7.5" (1.715 m), weight 157 lb 3.2 oz (71.305 kg).   Physical Exam Physical Exam  Constitutional: She is oriented to person, place, and time. She appears well-developed and  well-nourished. No distress.  HENT:   Head: Normocephalic and atraumatic.  Right Ear: External ear normal.  Left Ear: External ear normal.   Nose: Nose normal.   Mouth/Throat: Oropharynx is clear and moist. No oropharyngeal exudate.  Eyes: Conjunctivae are normal. Pupils are equal, round, and reactive to light. No scleral icterus.  Neck: Normal range of motion. Neck supple. No JVD present. No tracheal deviation present. No thyromegaly present.  Cardiovascular: Normal rate, regular rhythm and normal heart sounds.    No murmur heard. Pulmonary/Chest: Effort normal and breath sounds normal. No respiratory distress. She has no wheezes.  Abdominal: Soft. Bowel sounds are normal. There is no tenderness.  Musculoskeletal: Normal range of motion. She exhibits no edema and no tenderness.  Lymphadenopathy:    She has no cervical adenopathy.  Neurological: She is alert and oriented to person, place, and time.  Skin: Skin is warm and dry. No rash noted. She is not diaphoretic. No erythema.  Psychiatric: Her behavior is normal. Judgment normal.  On breast exam, there is ecchymosis of the right breast and the biopsy site. There is so much swelling and hematoma that I cannot palpate Carter breast mass. There is no right axillary  adenopathy   Data Reviewed I have affixed mammogram and ultrasound of the breast which I reviewed. 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 As I cannot palpate Carter mass, needle localization would be needed for needle localized lumpectomy. I discussed the risks of surgery with her including the risk of bleeding infection injury, and Carter chest finding malignancy would need for further surgery. She understands and wished to proceed. She will call to schedule surgery. I told her that it was very likely that the mass be removed and hopefully findings will be benign       Deanna Carter 03/02/2011, 2:17 PM                Not recorded              Level of Service     PR OFFICE CONSULTATION,LEVEL III [16109]      Follow-up and Disposition     Return for pt to call back to schedule surgery.        All Flowsheet Templates (all recorded)     Encounter Vitals Flowsheet    Custom Formula Data Flowsheet    Anthropometrics Flowsheet               Referring Provider          Deanna Carter       All Charges for This Encounter       Code Description Service Date Service Provider Modifiers Quantity    [phone] PR OFFICE CONSULTATION,LEVEL III 03/02/2011 Shelly Rubenstein, MD   1        Other Encounter Related Information     Allergies & Medications         Problem List         History         Patient-Entered Questionnaires     No data filed          Deanna Carter   03/02/2011 1:50 PM Office Visit  MRN: [id number]   Description: 45 year old female  Provider: Shelly Rubenstein, MD  Department: [facility]  Diagnoses     Breast mass, right   - Primary    611.72      Reason for Visit     Other    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 female.   HPI Patient referred by Dr. Guinevere Carter for evaluation of Carter right breast mass. The patient palpated the mass herself. She then had mammograms and ultrasound demonstrating Carter right breast mass. She is here after core biopsy of the mass. She has no complaints regarding her breast. She denies nipple discharge. Her family history is negative for breast cancer Past Medical History   Diagnosis  Date   .  GERD (gastroesophageal reflux disease)     .  Generalized headaches         tension - only takes medication as needed to treat       Past Surgical History   Procedure  Date   .  Bladder suspension  2009   .  Tubal ligation  2005      History reviewed. No pertinent family history.   Social History History   Substance Use Topics   .  Smoking status:  Never Smoker    .  Smokeless tobacco:  Never Used   .  Alcohol Use:  No      No Known Allergies    Current Outpatient Prescriptions   Medication  Sig  Dispense  Refill   .  Hydrocodone-Guaifenesin (NARCOF PO)  Take 325 mg by mouth as needed.           Deanna Carter Triphasic (ORTHO TRI-CYCLEN, 28, PO)  Take by mouth daily. Pt taking the generic version of this medication.          Marland Kitchen  zolpidem (AMBIEN) 10 MG tablet  Take 10 mg by mouth as needed.              Review of Systems Review of Systems  Constitutional: Negative.   HENT: Negative.   Eyes: Negative.   Cardiovascular: Negative.   Gastrointestinal: Negative.   Genitourinary: Negative.   Musculoskeletal: Negative.   Neurological: Negative.   Hematological:  Negative.   Psychiatric/Behavioral: Negative.     Blood pressure 124/90, pulse 62, temperature 98 F (36.7 C), temperature source Temporal, resp. rate 14, height 5' 7.5" (1.715 m), weight 157 lb 3.2 oz (71.305 kg).   Physical Exam Physical Exam  Constitutional: She is oriented to person, place, and time. She appears well-developed and well-nourished. No distress.  HENT:   Head: Normocephalic and atraumatic.  Right Ear: External ear normal.  Left Ear: External ear normal.   Nose: Nose normal.   Mouth/Throat: Oropharynx is clear and moist.  No oropharyngeal exudate.  Eyes: Conjunctivae are normal. Pupils are equal, round, and reactive to light. No scleral icterus.  Neck: Normal range of motion. Neck supple. No JVD present. No tracheal deviation present. No thyromegaly present.  Cardiovascular: Normal rate, regular rhythm and normal heart sounds.    No murmur heard. Pulmonary/Chest: Effort normal and breath sounds normal. No respiratory distress. She has no wheezes.  Abdominal: Soft. Bowel sounds are normal. There is no tenderness.  Musculoskeletal: Normal range of motion. She exhibits no edema and no tenderness.  Lymphadenopathy:    She has no cervical adenopathy.  Neurological: She is alert and oriented to person, place, and time.  Skin: Skin is warm and dry. No rash noted. She is not diaphoretic. No erythema.  Psychiatric: Her behavior is normal. Judgment normal.  On breast exam, there is ecchymosis of the right breast and the biopsy site. There is so much swelling and hematoma that I cannot palpate Carter breast mass. There is no right axillary adenopathy   Data Reviewed I have affixed mammogram and ultrasound of the breast which I reviewed. 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 As I cannot palpate Carter mass, needle  localization would be needed for needle localized lumpectomy. I discussed the risks of surgery with her including the risk of bleeding infection injury, and Carter chest finding malignancy would need for further surgery. She understands and wished to proceed. She will call to schedule surgery. I told her that it was very likely that the mass be removed and hopefully findings will be benign       Bronwyn Belasco Carter 03/02/2011, 2:17 PM                Not recorded              Level of Service     PR OFFICE CONSULTATION,LEVEL III [60454]      Follow-up and Disposition     Return for pt to call back to schedule surgery.        All Flowsheet Templates (all recorded)     Encounter Vitals Flowsheet    Custom Formula Data Flowsheet    Anthropometrics Flowsheet               Referring Provider          Deanna Carter       All Charges for This Encounter       Code Description Service Date Service Provider Modifiers Quantity    [id number] PR OFFICE CONSULTATION,LEVEL III 03/02/2011 Shelly Rubenstein, MD   1        Other Encounter Related Information     Allergies & Medications         Problem List         History         Patient-Entered Questionnaires     No data filed

## 2011-03-26 NOTE — Anesthesia Postprocedure Evaluation (Signed)
  Anesthesia Post-op Note  Patient: Deanna Carter  Procedure(s) Performed:  MASTECTOMY PARTIAL - NEEDLE LOCALIZED RIGHT BREAST PARTIAL MASTECTOMY  Patient Location: PACU  Anesthesia Type: General  Level of Consciousness: awake  Airway and Oxygen Therapy: Patient Spontanous Breathing  Post-op Pain: mild  Post-op Assessment: Post-op Vital signs reviewed  Post-op Vital Signs: stable  Complications: No apparent anesthesia complications 

## 2011-03-26 NOTE — Preoperative (Signed)
Beta Blockers   Reason not to administer Beta Blockers:Not Applicable 

## 2011-03-30 ENCOUNTER — Encounter (INDEPENDENT_AMBULATORY_CARE_PROVIDER_SITE_OTHER): Payer: Self-pay | Admitting: Surgery

## 2011-04-02 ENCOUNTER — Encounter (HOSPITAL_COMMUNITY): Payer: Self-pay | Admitting: Surgery

## 2011-04-06 ENCOUNTER — Encounter (INDEPENDENT_AMBULATORY_CARE_PROVIDER_SITE_OTHER): Payer: Self-pay | Admitting: Surgery

## 2011-04-06 ENCOUNTER — Ambulatory Visit (INDEPENDENT_AMBULATORY_CARE_PROVIDER_SITE_OTHER): Payer: BC Managed Care – PPO | Admitting: Surgery

## 2011-04-06 VITALS — BP 136/88 | HR 60 | Temp 97.7°F | Resp 16 | Ht 67.5 in | Wt 157.2 lb

## 2011-04-06 DIAGNOSIS — Z09 Encounter for follow-up examination after completed treatment for conditions other than malignant neoplasm: Secondary | ICD-10-CM

## 2011-04-06 NOTE — Progress Notes (Signed)
Subjective:     Patient ID: Deanna Carter, female   DOB: 11-20-1965, 45 y.o.   MRN: [id number]  HPI She is here for her first postoperative visit status post excision of a right breast papilloma. She complains of mild discomfort at the areola  Review of Systems     Objective:   Physical Exam On exam, the incision is well-healed without evidence of infection.  The final pathology confirmed intraductal papilloma with no evidence of atypia or malignancy    Assessment:     Patient status post excision of right breast papilloma    Plan:        She will continue her yearly mammograms and self-examinations. I will see her back as needed

## 2012-05-30 ENCOUNTER — Other Ambulatory Visit: Payer: Self-pay | Admitting: Otolaryngology

## 2012-05-30 ENCOUNTER — Ambulatory Visit
Admission: RE | Admit: 2012-05-30 | Discharge: 2012-05-30 | Disposition: A | Discharge status: Home / Self Care | Payer: BC Managed Care – PPO | Source: Ambulatory Visit | Attending: Otolaryngology | Admitting: Otolaryngology

## 2012-05-30 DIAGNOSIS — R51 Headache: Secondary | ICD-10-CM

## 2012-09-08 ENCOUNTER — Encounter: Payer: Self-pay | Admitting: *Deleted

## 2012-12-01 ENCOUNTER — Ambulatory Visit (INDEPENDENT_AMBULATORY_CARE_PROVIDER_SITE_OTHER): Payer: BC Managed Care – PPO | Admitting: Nurse Practitioner

## 2012-12-01 ENCOUNTER — Encounter: Payer: Self-pay | Admitting: Nurse Practitioner

## 2012-12-01 VITALS — BP 117/75 | HR 56 | Ht 68.0 in | Wt 167.0 lb

## 2012-12-01 DIAGNOSIS — G43909 Migraine, unspecified, not intractable, without status migrainosus: Secondary | ICD-10-CM

## 2012-12-01 MED ORDER — SUMATRIPTAN SUCCINATE 100 MG PO TABS: 100.0000 mg | ORAL_TABLET | ORAL | Status: DC | PRN

## 2012-12-01 NOTE — Progress Notes (Signed)
HPI: Deanna Carter , 47 year old female returns for followup. She has a history of  migraine headache for more than 12 years, only happened occasionally, responded very well to Imitrex injection, she has been doing very well for many years, began to develop frequent prolonged, severe migraine headaches again over the past year. Couple times a week, she has mild lateralized headaches, over past 6 months, she had 2 severe migraine headaches, left retro-orbital area severe pounding headache, with associated light noise sensitivity, lasting for 2-3 days, over-the-counter medicine would not help. She denies visual change, lateralized motor or sensory deficit in between episodes, Trigger for migraines a bariatric pressure changes, stress CT sinus was normal She returns for followup and continues to have headaches that are relieved by Tylenol as well as migraine headaches relieved by Imitrex. She denies any visual changes motor or sensory deficits in between these episodes. Magnesium and riboflavin were not beneficial so she stopped these. She has not kept a record of her headaches, she is not aware of any food triggers  ROS:  headache  Physical Exam General: well developed, well nourished, seated, in no evident distress Head: head normocephalic and atraumatic. Oropharynx benign Neck: supple with no carotid  bruits Cardiovascular: regular rate and rhythm, no murmurs  Neurologic Exam Mental Status: Awake and fully alert. Oriented to place and time. Follows all commands. Speech and language normal.   Cranial Nerves:  Pupils equal, briskly reactive to light. Extraocular movements full without nystagmus. Visual fields full to confrontation. Hearing intact and symmetric to finger snap. Facial sensation intact. Face, tongue, palate move normally and symmetrically. Neck flexion and extension normal.  Motor: Normal bulk and tone. Normal strength in all tested extremity muscles.No focal weakness Sensory.: intact to  touch and pinprick and vibratory.  Coordination: Rapid alternating movements normal in all extremities. Finger-to-nose and heel-to-shin performed accurately bilaterally. Gait and Station: Arises from chair without difficulty. Stance is normal. Gait demonstrates normal stride length and balance . Able to heel, toe and tandem walk without difficulty.  Reflexes: 2+ and symmetric. Toes downgoing.     ASSESSMENT: Migraine headache     PLAN: Will change Imitrex 100 mg tablet take prn Will keep a record of headaches for the next month and bring to next appointment Given information on  all migraine triggers to include foods Followup in 4-6 weeks  Nilda Riggs, GNP-BC APRN

## 2012-12-01 NOTE — Patient Instructions (Addendum)
Will change Imitrex 100 mg tablet take prn Will keep a record of headaches for the next month and bring to next appointment Given information on  migraine triggers Followup in 4-6 weeks

## 2012-12-29 ENCOUNTER — Emergency Department (INDEPENDENT_AMBULATORY_CARE_PROVIDER_SITE_OTHER)
Admission: EM | Admit: 2012-12-29 | Discharge: 2012-12-29 | Disposition: A | Discharge status: Home / Self Care | Payer: BC Managed Care – PPO | Source: Home / Self Care | Attending: Family Medicine | Admitting: Family Medicine

## 2012-12-29 ENCOUNTER — Encounter: Payer: Self-pay | Admitting: Emergency Medicine

## 2012-12-29 DIAGNOSIS — M26629 Arthralgia of temporomandibular joint, unspecified side: Secondary | ICD-10-CM

## 2012-12-29 NOTE — ED Provider Notes (Signed)
CSN: [id number]     Arrival date & time 12/29/12  1930 History     First MD Initiated Contact with Patient 12/29/12 1940     Chief Complaint  Patient presents with  . Otalgia      HPI Comments: Patient complains of left earache for 3 days, and also ache in her left posterior mandibular teeth.  No drainage from ear.  Ear does not feel clogged.  No recent URI.  No nasal congestion.  No fevers, chills, and sweats.  No recent swimming.  She was seen in [facility] and advised that she should follow-up at urgent care. She denies bruxism.  The history is provided by the patient.    Past Medical History  Diagnosis Date  . GERD (gastroesophageal reflux disease)   . Generalized headaches     tension - only takes medication as needed to treat   Past Surgical History  Procedure Laterality Date  . Bladder suspension  2005, 2009  . Tubal ligation  2005  . Breast surgery  03/26/2011    Right Breast Mass  . Mastectomy, partial  03/26/2011    Procedure: MASTECTOMY PARTIAL;  Surgeon: Shelly Rubenstein, MD;  Location: [facility] OR;  Service: General;  Laterality: Right;  NEEDLE LOCALIZED RIGHT BREAST PARTIAL MASTECTOMY   No family history on file. History  Substance Use Topics  . Smoking status: Never Smoker   . Smokeless tobacco: Never Used  . Alcohol Use: No   OB History   Grav Para Term Preterm Abortions TAB SAB Ect Mult Living                 Review of Systems No sore throat No cough No pleuritic pain No wheezing No nasal congestion No post-nasal drainage No sinus pain/pressure No itchy/red eyes + left earache No hemoptysis No SOB No fever/chills No nausea No vomiting No abdominal pain No diarrhea No urinary symptoms No skin rashes No fatigue No myalgias + headache Used OTC meds without relief  Allergies  Review of patient's allergies indicates not on file.  Home Medications   Current Outpatient Rx  Name  Route  Sig  Dispense  Refill  . acetaminophen (TYLENOL)  500 MG tablet   Oral   Take 1,000 mg by mouth every 6 (six) hours as needed. For pain          . ACZONE 5 % topical gel      daily.         . Ascorbic Acid (VITAMIN C PO)   Oral   Take 1 tablet by mouth 2 (two) times daily.           . Calcium-Magnesium 500-250 MG TABS   Oral   Take 1 tablet by mouth 3 (three) times daily.           . cycloSPORINE (RESTASIS) 0.05 % ophthalmic emulsion   Both Eyes   Place 1 drop into both eyes 2 (two) times daily.           . fish oil-omega-3 fatty acids 1000 MG capsule   Oral   Take 1 g by mouth 2 (two) times daily.           . fluticasone (FLONASE) 50 MCG/ACT nasal spray      daily.         . hydroxypropyl methylcellulose (ISOPTO TEARS) 2.5 % ophthalmic solution   Both Eyes   Place 1-2 drops into both eyes 3 (three) times daily as needed. For  dry eyes          . ibuprofen (ADVIL,MOTRIN) 200 MG tablet   Oral   Take 600 mg by mouth every 6 (six) hours as needed. For pain          . Norgestim-Eth Estrad Triphasic (ORTHO TRI-CYCLEN, 28, PO)   Oral   Take 1 tablet by mouth daily. Pt taking the generic version of this medication.         Marland Kitchen olopatadine (PATANOL) 0.1 % ophthalmic solution   Both Eyes   Place 1 drop into both eyes 2 (two) times daily.           Marland Kitchen OVER THE COUNTER MEDICATION   Oral   Take 1 capsule by mouth 3 (three) times daily. Vitamin supplement, Premrose          . OVER THE COUNTER MEDICATION   Oral   Take 1 packet by mouth daily as needed. Goodies Powder as needed for pain          . SUMAtriptan (IMITREX) 100 MG tablet   Oral   Take 1 tablet (100 mg total) by mouth every 2 (two) hours as needed for migraine. No more than 2 in 24 hour period   10 tablet   2   . VITAMIN D, CHOLECALCIFEROL, PO   Oral   Take 1 tablet by mouth 2 (two) times daily.           Marland Kitchen VITAMIN E PO   Oral   Take 1 capsule by mouth daily.           Marland Kitchen zolpidem (AMBIEN) 10 MG tablet   Oral   Take 10 mg by  mouth as needed. For sleep         . diazepam (VALIUM) 2 MG tablet   Oral   Take 2 mg by mouth as needed. For dizziness           . omeprazole (PRILOSEC) 20 MG capsule   Oral   Take 20 mg by mouth every morning.            BP 131/84  Pulse 54  Temp(Src) 97.7 F (36.5 C) (Oral)  Ht 5\' 7"  (1.702 m)  Wt 160 lb (72.576 kg)  BMI 25.05 kg/m2  SpO2 100% Physical Exam Nursing notes and Vital Signs reviewed. Appearance:  Patient appears healthy, stated age, and in no acute distress Eyes:  Pupils are equal, round, and reactive to light and accomodation.  Extraocular movement is intact.  Conjunctivae are not inflamed  Ears:  Canals normal.  Tympanic membranes normal.  There is distinct tenderness over the left temporomandibular joint.  Palpation there recreates her pain.    Nose:  Mildly congested turbinates.  No sinus tenderness.    Pharynx:  Normal Neck:  Supple.  Slightly tender shotty left posterior nodes are palpated bilaterally  Lungs:  Clear to auscultation.  Breath sounds are equal.  Heart:  Regular rate and rhythm without murmurs, rubs, or gallops.  Skin:  No rash present.   ED Course   Procedures  none    1. TMJ arthralgia     MDM   Apply ice pack for 20 minutes three to four times daily.  Avoid chewy foods.  May take Aleve, 2 tabs every 12 hours, or Ibuprofen 200mg , 4 tabs every 8 hours with food.  Followup with ENT if not improving  Lattie Haw, MD 12/30/12 1312

## 2012-12-29 NOTE — ED Notes (Signed)
Left ear pain x 3 days.   

## 2012-12-31 ENCOUNTER — Telehealth: Payer: Self-pay

## 2012-12-31 NOTE — ED Notes (Signed)
I called and spoke with patient and she is doing better. I advised to call back if anything changes or if she has questions or concerns.  

## 2013-02-14 ENCOUNTER — Ambulatory Visit: Payer: BC Managed Care – PPO | Admitting: Nurse Practitioner

## 2013-03-16 ENCOUNTER — Ambulatory Visit: Payer: BC Managed Care – PPO | Admitting: Nurse Practitioner

## 2013-03-16 ENCOUNTER — Encounter (INDEPENDENT_AMBULATORY_CARE_PROVIDER_SITE_OTHER): Payer: Self-pay

## 2013-03-16 ENCOUNTER — Ambulatory Visit (INDEPENDENT_AMBULATORY_CARE_PROVIDER_SITE_OTHER): Payer: BC Managed Care – PPO | Admitting: Nurse Practitioner

## 2013-03-16 ENCOUNTER — Encounter: Payer: Self-pay | Admitting: Nurse Practitioner

## 2013-03-16 VITALS — BP 138/83 | HR 59 | Ht 67.5 in | Wt 169.0 lb

## 2013-03-16 DIAGNOSIS — G43909 Migraine, unspecified, not intractable, without status migrainosus: Secondary | ICD-10-CM

## 2013-03-16 MED ORDER — SUMATRIPTAN SUCCINATE 100 MG PO TABS: 100.0000 mg | ORAL_TABLET | ORAL | Status: DC | PRN

## 2013-03-16 NOTE — Progress Notes (Signed)
[facility]  PATIENT: Deanna Carter DOB: 04-01-1966   REASON FOR VISIT: Followup for migraine  HISTORY OF PRESENT ILLNESS: [name], 47 year-old white female returns for followup. She has a history of migraine headache for more than 12 years, only happened occasionally, responded very well to Imitrex injection, she has been doing very well for many years, began to develop frequent prolonged, severe migraine headaches again over the past year. Couple times a week, she has mild lateralized headaches, over past 6 months, she had 2 severe migraine headaches, left retro-orbital area severe pounding headache, with associated light noise sensitivity, lasting for 2-3 days, over-the-counter medicine would not help. She denies visual change, lateralized motor or sensory deficit in between episodes,  Trigger for migraines a bariatric pressure changes, stress CT sinus was normal   03/16/13  She returns for followup and continues to have headaches that are relieved by Tylenol as well as migraine headaches relieved by Imitrex. She denies any visual changes motor or sensory deficits in between these episodes. She has kept a  record of her headaches, and she is having no more than 2 a month classified as a 4, and no 5's. Imitrex continues to work acutely. She is not aware of any food triggers    REVIEW OF SYSTEMS: Full 14 system review of systems performed and notable only for:  Constitutional: N/A  Cardiovascular: N/A  Ear/Nose/Throat: N/A  Skin: N/A  Eyes: N/A  Respiratory: N/A  Gastroitestinal: N/A  Hematology/Lymphatic: N/A  Endocrine: N/A Musculoskeletal:N/A  Allergy/Immunology: N/A  Neurological: Headache, dizziness  Psychiatric: N/A   ALLERGIES: Not on File  HOME MEDICATIONS: Outpatient Prescriptions Prior to Visit  Medication Sig Dispense Refill  . acetaminophen (TYLENOL) 500 MG tablet Take 1,000 mg by mouth every 6 (six) hours as needed. For pain       .  ACZONE 5 % topical gel daily.      . Ascorbic Acid (VITAMIN C PO) Take 1 tablet by mouth 2 (two) times daily.        . cycloSPORINE (RESTASIS) 0.05 % ophthalmic emulsion Place 1 drop into both eyes 2 (two) times daily.        . diazepam (VALIUM) 2 MG tablet Take 2 mg by mouth as needed. For dizziness      . fish oil-omega-3 fatty acids 1000 MG capsule Take 1 g by mouth 2 (two) times daily.        . fluticasone (FLONASE) 50 MCG/ACT nasal spray daily.      . hydroxypropyl methylcellulose (ISOPTO TEARS) 2.5 % ophthalmic solution Place 1-2 drops into both eyes 3 (three) times daily as needed. For dry eyes       . ibuprofen (ADVIL,MOTRIN) 200 MG tablet Take 600 mg by mouth every 6 (six) hours as needed. For pain       . Norgestim-Eth Estrad Triphasic (ORTHO TRI-CYCLEN, 28, PO) Take 1 tablet by mouth daily. Pt taking the generic version of this medication.      Marland Kitchen olopatadine (PATANOL) 0.1 % ophthalmic solution Place 1 drop into both eyes 2 (two) times daily.        Marland Kitchen omeprazole (PRILOSEC) 20 MG capsule Take 20 mg by mouth every morning.        Marland Kitchen OVER THE COUNTER MEDICATION Take 1 packet by mouth daily as needed. Goodies Powder as needed for pain       . SUMAtriptan (IMITREX) 100 MG tablet Take 1 tablet (100 mg total) by mouth every  2 (two) hours as needed for migraine. No more than 2 in 24 hour period  10 tablet  2  . VITAMIN D, CHOLECALCIFEROL, PO Take 1 tablet by mouth 2 (two) times daily.        Marland Kitchen VITAMIN E PO Take 1 capsule by mouth daily.        Marland Kitchen zolpidem (AMBIEN) 10 MG tablet Take 10 mg by mouth as needed. For sleep      . Calcium-Magnesium 500-250 MG TABS Take 1 tablet by mouth 3 (three) times daily.        Marland Kitchen OVER THE COUNTER MEDICATION Take 1 capsule by mouth 3 (three) times daily. Vitamin supplement, Premrose        No facility-administered medications prior to visit.    PAST MEDICAL HISTORY: Past Medical History  Diagnosis Date  . GERD (gastroesophageal reflux disease)   . Generalized  headaches     tension - only takes medication as needed to treat    PAST SURGICAL HISTORY: Past Surgical History  Procedure Laterality Date  . Bladder suspension  2005, 2009  . Tubal ligation  2005  . Breast surgery  03/26/2011    Right Breast Mass  . Mastectomy, partial  03/26/2011    Procedure: MASTECTOMY PARTIAL;  Surgeon: Shelly Rubenstein, MD;  Location: [facility] OR;  Service: General;  Laterality: Right;  NEEDLE LOCALIZED RIGHT BREAST PARTIAL MASTECTOMY    FAMILY HISTORY: History reviewed. No pertinent family history.  SOCIAL HISTORY: History   Social History  . Marital Status: Married    Spouse Name: N/A    Number of Children: N/A  . Years of Education: N/A   Occupational History  . Not on file.   Social History Main Topics  . Smoking status: Never Smoker   . Smokeless tobacco: Never Used  . Alcohol Use: No  . Drug Use: No  . Sexual Activity: Yes    Birth Control/ Protection: Pill   Other Topics Concern  . Not on file   Social History Narrative  . No narrative on file     PHYSICAL EXAM  Filed Vitals:   03/16/13 0902  BP: 138/83  Pulse: 59  Height: 5' 7.5" (1.715 m)  Weight: 169 lb (76.658 kg)   Body mass index is 26.06 kg/(m^2).  Generalized: Well developed, in no acute distress  Head: normocephalic and atraumatic,. Oropharynx benign ears benign Neck: Supple, no carotid bruits  Cardiac: Regular rate rhythm, no murmur  Musculoskeletal: No deformity   Neurological examination   Mentation: Alert oriented to time, place, history taking. Follows all commands speech and language fluent  Cranial nerve II-XII: .Pupils were equal round reactive to light extraocular movements were full, visual field were full on confrontational test. Facial sensation and strength were normal. hearing was intact to finger rubbing bilaterally. Uvula tongue midline. head turning and shoulder shrug and were normal and symmetric.Tongue protrusion into cheek strength was  normal. Motor: normal bulk and tone, full strength in the BUE, BLE, fine finger movements normal, no pronator drift. No focal weakness Sensory: normal and symmetric to light touch, pinprick, and  vibration  Coordination: finger-nose-finger, heel-to-shin bilaterally, no dysmetria Reflexes: Brachioradialis 2/2, biceps 2/2, triceps 2/2, patellar 2/2, Achilles 2/2, plantar responses were flexor bilaterally. Gait and Station: Rising up from seated position without assistance, normal stance, without trunk ataxia, moderate stride, good arm swing, smooth turning, able to perform tiptoe, and heel walking without difficulty. Tandem gait steady   DIAGNOSTIC DATA (LABS, IMAGING, TESTING) -None to  review   ASSESSMENT AND PLAN  47 y.o. year old female  has a past medical history of GERD (gastroesophageal reflux disease) and Generalized headaches. here to followup. She has kept  a record of her migraines and is having 1 to 2  severe headaches per month relieved with Imitrex  Continue Imitrex acute migraine Call if headaches worsen F/U 6 months Nilda Riggs, [facility], [facility], APRN  [facility] [address], [address] [place], [place] [address] [phone]

## 2013-03-16 NOTE — Patient Instructions (Signed)
Continue Imitrex acutely migraine Call if headaches worsen F/U 6 months

## 2013-09-14 ENCOUNTER — Encounter (INDEPENDENT_AMBULATORY_CARE_PROVIDER_SITE_OTHER): Payer: Self-pay

## 2013-09-14 ENCOUNTER — Ambulatory Visit (INDEPENDENT_AMBULATORY_CARE_PROVIDER_SITE_OTHER): Payer: BC Managed Care – PPO | Admitting: Nurse Practitioner

## 2013-09-14 ENCOUNTER — Encounter: Payer: Self-pay | Admitting: Nurse Practitioner

## 2013-09-14 VITALS — BP 117/77 | HR 55 | Ht 67.0 in | Wt 169.0 lb

## 2013-09-14 DIAGNOSIS — G43909 Migraine, unspecified, not intractable, without status migrainosus: Secondary | ICD-10-CM

## 2013-09-14 MED ORDER — SUMATRIPTAN SUCCINATE 100 MG PO TABS: 100.0000 mg | ORAL_TABLET | ORAL | Status: DC | PRN

## 2013-09-14 NOTE — Patient Instructions (Signed)
Continue Imitrex acutely for migraine will refill If headaches worsen keep a headache diary Follow up in 6 months and when necessary

## 2013-09-14 NOTE — Progress Notes (Signed)
[facility]  PATIENT: Deanna Carter DOB: 04/27/1966   REASON FOR VISIT: Followup for migraines  HISTORY OF PRESENT ILLNESS: [name], 48 year old female returns for followup. She has a history of migraine headaches for about 13 years respond well to Imitrex. She has never been able to figure out migraine triggers except weather changes and sometimes stress. She is not aware of any foods that cause problems .She denies visual changes lateralized motor or sensory deficits in between episodes, headaches always starts left retro-orbital area associated with noise and light sensitivity can last several days but this does not happen frequently. She is having one to 2 a month. She returns for reevaluation    HISTORY: She has a history of migraine headache for more than 12 years, only happened occasionally, responded very well to Imitrex injection, she has been doing very well for many years, began to develop frequent prolonged, severe migraine headaches again over the past year. Couple times a week, she has mild lateralized headaches, over past 6 months, she had 2 severe migraine headaches, left retro-orbital area severe pounding headache, with associated light noise sensitivity, lasting for 2-3 days, over-the-counter medicine would not help. She denies visual change, lateralized motor or sensory deficit in between episodes,  Trigger for migraines a bariatric pressure changes, stress CT sinus was normal     REVIEW OF SYSTEMS: Full 14 system review of systems performed and notable only for those listed, all others are neg:  Constitutional: N/A  Cardiovascular: N/A  Ear/Nose/Throat: N/A  Skin: N/A  Eyes: N/A  Respiratory: N/A  Gastroitestinal: N/A  Hematology/Lymphatic: N/A  Endocrine: N/A Musculoskeletal:N/A  Allergy/Immunology: N/A  Neurological:  headache  Psychiatric: N/A   ALLERGIES: No Known Allergies  HOME MEDICATIONS: Outpatient Prescriptions Prior  to Visit  Medication Sig Dispense Refill  . acetaminophen (TYLENOL) 500 MG tablet Take 1,000 mg by mouth every 6 (six) hours as needed. For pain       . ACZONE 5 % topical gel daily.      . Ascorbic Acid (VITAMIN C PO) Take 1 tablet by mouth 2 (two) times daily.        . cycloSPORINE (RESTASIS) 0.05 % ophthalmic emulsion Place 1 drop into both eyes 2 (two) times daily.        . diazepam (VALIUM) 2 MG tablet Take 2 mg by mouth as needed. For dizziness      . fish oil-omega-3 fatty acids 1000 MG capsule Take 1 g by mouth 2 (two) times daily.        . fluticasone (FLONASE) 50 MCG/ACT nasal spray daily.      . hydroxypropyl methylcellulose (ISOPTO TEARS) 2.5 % ophthalmic solution Place 1-2 drops into both eyes 3 (three) times daily as needed. For dry eyes       . ibuprofen (ADVIL,MOTRIN) 200 MG tablet Take 600 mg by mouth every 6 (six) hours as needed. For pain       . Norgestim-Eth Estrad Triphasic (ORTHO TRI-CYCLEN, 28, PO) Take 1 tablet by mouth daily. Pt taking the generic version of this medication.      Marland Kitchen olopatadine (PATANOL) 0.1 % ophthalmic solution Place 1 drop into both eyes 2 (two) times daily.        Marland Kitchen omeprazole (PRILOSEC) 20 MG capsule Take 20 mg by mouth every morning.        Marland Kitchen OVER THE COUNTER MEDICATION Take 1 packet by mouth daily as needed. Goodies Powder as needed for pain       .  SUMAtriptan (IMITREX) 100 MG tablet Take 1 tablet (100 mg total) by mouth every 2 (two) hours as needed for migraine. No more than 2 in 24 hour period  10 tablet  6  . VITAMIN D, CHOLECALCIFEROL, PO Take 1 tablet by mouth 2 (two) times daily.        Marland Kitchen zolpidem (AMBIEN) 10 MG tablet Take 10 mg by mouth as needed. For sleep      . VITAMIN E PO Take 1 capsule by mouth daily.         No facility-administered medications prior to visit.    PAST MEDICAL HISTORY: Past Medical History  Diagnosis Date  . GERD (gastroesophageal reflux disease)   . Generalized headaches     tension - only takes medication  as needed to treat    PAST SURGICAL HISTORY: Past Surgical History  Procedure Laterality Date  . Bladder suspension  2005, 2009  . Tubal ligation  2005  . Breast surgery  03/26/2011    Right Breast Mass  . Mastectomy, partial  03/26/2011    Procedure: MASTECTOMY PARTIAL;  Surgeon: Harl Bowie, MD;  Location: [facility];  Service: General;  Laterality: Right;  NEEDLE LOCALIZED RIGHT BREAST PARTIAL MASTECTOMY    FAMILY HISTORY: History reviewed. No pertinent family history.  SOCIAL HISTORY: History   Social History  . Marital Status: Married    Spouse Name: Abe People     Number of Children: 2  . Years of Education: college   Occupational History  .  [employer]   Social History Main Topics  . Smoking status: Never Smoker   . Smokeless tobacco: Never Used  . Alcohol Use: No  . Drug Use: No  . Sexual Activity: Yes    Birth Control/ Protection: Pill   Other Topics Concern  . Not on file   Social History Narrative   Patient lives at home with husband Abe People.    Patient has 2 children.    Patient is currently working.    Patient has her Bachelors    Patient is right handed.     PHYSICAL EXAM  Filed Vitals:   09/14/13 1410  BP: 117/77  Pulse: 55  Height: 5\' 7"  (1.702 m)  Weight: 169 lb (76.658 kg)   Body mass index is 26.46 kg/(m^2).  Generalized: Well developed, in no acute distress  Head: normocephalic and atraumatic,. Oropharynx benign  Neck: Supple, no carotid bruits  Cardiac: Regular rate rhythm, no murmur  Musculoskeletal: No deformity   Neurological examination   Mentation: Alert oriented to time, place, history taking. Follows all commands speech and language fluent  Cranial nerve II-XII: Fundoscopic exam reveals sharp disc margins.Pupils were equal round reactive to light extraocular movements were full, visual field were full on confrontational test. Facial sensation and strength were normal. hearing was intact to finger rubbing  bilaterally. Uvula tongue midline. head turning and shoulder shrug were normal and symmetric.Tongue protrusion into cheek strength was normal. Motor: normal bulk and tone, full strength in the BUE, BLE, fine finger movements normal, no pronator drift. No focal weakness Sensory: normal and symmetric to light touch, pinprick, and  vibration  Coordination: finger-nose-finger, heel-to-shin bilaterally, no dysmetria Reflexes: Brachioradialis 2/2, biceps 2/2, triceps 2/2, patellar 2/2, Achilles 2/2, plantar responses were flexor bilaterally. Gait and Station: Rising up from seated position without assistance, normal stance,  moderate stride, good arm swing, smooth turning, able to perform tiptoe, and heel walking without difficulty. Tandem gait is steady  DIAGNOSTIC DATA (LABS,  IMAGING, TESTING) ASSESSMENT AND PLAN  48 y.o. year old female  has a past medical history of GERD (gastroesophageal reflux disease) and Generalized headaches. here  to followup. She has about 1-2 migraines per month generally caused by weather changes or stress.  Continue Imitrex acutely for migraine will refill If headaches worsen keep a headache diary Follow up in 6 months and when necessary Dennie Bible, [facility], [facility], [facility] [address], [place], [place] [address] [phone]

## 2013-09-20 ENCOUNTER — Other Ambulatory Visit: Payer: Self-pay | Admitting: Obstetrics and Gynecology

## 2013-09-20 DIAGNOSIS — N6452 Nipple discharge: Secondary | ICD-10-CM

## 2013-09-29 ENCOUNTER — Ambulatory Visit
Admission: RE | Admit: 2013-09-29 | Discharge: 2013-09-29 | Disposition: A | Discharge status: Home / Self Care | Payer: BC Managed Care – PPO | Source: Ambulatory Visit | Attending: Obstetrics and Gynecology | Admitting: Obstetrics and Gynecology

## 2013-09-29 ENCOUNTER — Other Ambulatory Visit: Payer: Self-pay | Admitting: Obstetrics and Gynecology

## 2013-09-29 DIAGNOSIS — N6452 Nipple discharge: Secondary | ICD-10-CM

## 2013-09-29 DIAGNOSIS — N6310 Unspecified lump in the right breast, unspecified quadrant: Secondary | ICD-10-CM

## 2013-10-03 ENCOUNTER — Other Ambulatory Visit: Payer: Self-pay | Admitting: Obstetrics and Gynecology

## 2013-10-03 ENCOUNTER — Ambulatory Visit
Admission: RE | Admit: 2013-10-03 | Discharge: 2013-10-03 | Disposition: A | Discharge status: Home / Self Care | Payer: BC Managed Care – PPO | Source: Ambulatory Visit | Attending: Obstetrics and Gynecology | Admitting: Obstetrics and Gynecology

## 2013-10-03 DIAGNOSIS — N6310 Unspecified lump in the right breast, unspecified quadrant: Secondary | ICD-10-CM

## 2013-10-03 DIAGNOSIS — N6452 Nipple discharge: Secondary | ICD-10-CM

## 2013-10-23 ENCOUNTER — Ambulatory Visit (INDEPENDENT_AMBULATORY_CARE_PROVIDER_SITE_OTHER): Payer: BC Managed Care – PPO | Admitting: Surgery

## 2013-10-23 ENCOUNTER — Encounter (INDEPENDENT_AMBULATORY_CARE_PROVIDER_SITE_OTHER): Payer: Self-pay | Admitting: Surgery

## 2013-10-23 VITALS — BP 118/70 | HR 68 | Temp 98.6°F | Resp 14 | Ht 67.5 in | Wt 166.0 lb

## 2013-10-23 DIAGNOSIS — D249 Benign neoplasm of unspecified breast: Secondary | ICD-10-CM

## 2013-10-23 NOTE — Progress Notes (Signed)
Subjective:     Patient ID: Deanna Carter, female   DOB: 1965/11/30, 48 y.o.   MRN: [id number]  HPI This is a pleasant patient that I removed a right breast papilloma on in the past. She now has another papilloma in her right breast. This presented again with clear nipple discharge.  Review of Systems     Objective:   Physical Exam On exam, she has bruising of the right breast from the biopsy area.  Again the mammogram and ultrasound demonstrate 2 separate masses at the 10:00 position of the right breast. Only one to be biopsied and confirmed papilloma    Assessment:     Right breast papilloma     Plan:     Right breast lumpectomyis recommended. I discussed this with her in detail. She agrees to proceed with surgery

## 2013-10-30 ENCOUNTER — Encounter (HOSPITAL_COMMUNITY): Payer: Self-pay | Admitting: Pharmacy Technician

## 2013-10-31 ENCOUNTER — Encounter (HOSPITAL_COMMUNITY): Payer: Self-pay

## 2013-10-31 ENCOUNTER — Encounter (HOSPITAL_COMMUNITY)
Admission: RE | Admit: 2013-10-31 | Discharge: 2013-10-31 | Disposition: A | Discharge status: Home / Self Care | Payer: BC Managed Care – PPO | Source: Ambulatory Visit | Attending: Surgery | Admitting: Surgery

## 2013-10-31 HISTORY — DX: Personal history of other diseases of the digestive system: Z87.19

## 2013-10-31 LAB — CBC
HCT: 36.7 % (ref 36.0–46.0)
Hemoglobin: 12.4 g/dL (ref 12.0–15.0)
MCH: 30.2 pg (ref 26.0–34.0)
MCHC: 33.8 g/dL (ref 30.0–36.0)
MCV: 89.3 fL (ref 78.0–100.0)
Platelets: 223 10*3/uL (ref 150–400)
RBC: 4.11 MIL/uL (ref 3.87–5.11)
RDW: 12.4 % (ref 11.5–15.5)
WBC: 7 10*3/uL (ref 4.0–10.5)

## 2013-10-31 LAB — HCG, SERUM, QUALITATIVE: Preg, Serum: NEGATIVE

## 2013-10-31 NOTE — Pre-Procedure Instructions (Addendum)
Deanna Carter  10/31/2013   Your procedure is scheduled on:  Thursday, June 18.  Report to [facility] Admitting at 10:00AM.  Call this number if you have problems the morning of surgery: [phone]   Remember:   Do not eat food or drink liquids after midnight Wednesday.    Take these medicines the morning of surgery with A SIP OF WATER: loratadine (CLARITIN), Norgestim-Eth Estrad Triphasic (ORTHO TRI-CYCLEN), omeprazole (PRILOSEC).              Take if needed:SUMAtriptan (IMITREX).               Stop taking Vitamins, Herbal Medications, ibuprofen (ADVIL,MOTRIN), Goodies Powder.    Do not wear jewelry, make-up or nail polish.  Do not wear lotions, powders, or perfumes.   Do not shave 48 hours prior to surgery.   Do not bring valuables to the hospital.              [facility] is not responsible for any valuables or personal belongings.              Contacts, dentures or bridgework may not be worn into surgery.  Leave suitcase in the car. After surgery it may be brought to your room.  For patients admitted to the hospital, discharge time is determined by your treatment team.               Patients discharged the day of surgery will not be allowed to drive home.                  Special Instructions:[place] - Preparing for Surgery  Before surgery, you can play an important role.  Because skin is not sterile, your skin needs to be as free of germs as possible.  You can reduce the number of germs on you skin by washing with CHG (chlorahexidine gluconate) soap before surgery.  CHG is an antiseptic cleaner which kills germs and bonds with the skin to continue killing germs even after washing.  Please DO NOT use if you have an allergy to CHG or antibacterial soaps.  If your skin becomes reddened/irritated stop using the CHG and inform your nurse when you arrive at Short Stay.  Do not shave (including legs and underarms) for at least 48 hours prior to the first CHG  shower.  You may shave your face.  Please follow these instructions carefully:   1.  Shower with CHG Soap the night before surgery and the                                morning of Surgery.  2.  If you choose to wash your hair, wash your hair first as usual with your       normal shampoo.  3.  After you shampoo, rinse your hair and body thoroughly to remove the                      Shampoo.  4.  Use CHG as you would any other liquid soap.  You can apply chg directly       to the skin and wash gently with scrungie or a clean washcloth.  5.  Apply the CHG Soap to your body ONLY FROM THE NECK DOWN.        Do not use on open wounds or open sores.  Avoid contact with  your eyes,       ears, mouth and genitals (private parts).  Wash genitals (private parts)       with your normal soap.  6.  Wash thoroughly, paying special attention to the area where your surgery        will be performed.  7.  Thoroughly rinse your body with warm water from the neck down.  8.  DO NOT shower/wash with your normal soap after using and rinsing off       the CHG Soap.  9.  Pat yourself dry with a clean towel.            10.  Wear clean pajamas.            11.  Place clean sheets on your bed the night of your first shower and do not        sleep with pets.  Day of Surgery  Do not apply any lotions/deoderants the morning of surgery.  Please wear clean clothes to the hospital/surgery center.      Please read over the following fact sheets that you were given: Pain Booklet, Coughing and Deep Breathing and Surgical Site Infection Prevention

## 2013-11-01 MED ORDER — CEFAZOLIN SODIUM-DEXTROSE 2-3 GM-% IV SOLR
2.0000 g | INTRAVENOUS | Status: AC
  Administered 2013-11-02: 2 g via INTRAVENOUS
  Filled 2013-11-01: qty 50

## 2013-11-01 NOTE — H&P (Signed)
Deanna Carter is an 48 y.o. female.   Chief Complaint: right breast papilloma HPI: this is a pleasant female who I performed a right breast lumpectomy on the past for papillomas. She presents again for nipple discharge and has an ultrasound and biopsy confirming another papilloma and a separate location of the right breast. There were actually 2 separate masses close together. The biopsy of the first mass created a hematoma obstructing further viewing of the second mass which was not biopsied. She is otherwise without complaints.  Past Medical History  Diagnosis Date  . GERD (gastroesophageal reflux disease)   . Generalized headaches     tension - only takes medication as needed to treat  . H/O hiatal hernia     Past Surgical History  Procedure Laterality Date  . Bladder suspension  2005, 2009  . Tubal ligation  2005  . Breast surgery  03/26/2011    Right Breast Mass  . Mastectomy, partial  03/26/2011  . Breast lumpectomy  2012  . Eye surgery Bilateral 1999    lasik    No family history on file. Social History:  reports that she has never smoked. She has never used smokeless tobacco. She reports that she does not drink alcohol or use illicit drugs.  Allergies: No Known Allergies  No prescriptions prior to admission    Results for orders placed during the hospital encounter of 10/31/13 (from the past 48 hour(s))  CBC     Status: None   Collection Time    10/31/13 12:36 PM      Result Value Ref Range   WBC 7.0  4.0 - 10.5 K/uL   RBC 4.11  3.87 - 5.11 MIL/uL   Hemoglobin 12.4  12.0 - 15.0 g/dL   HCT 36.7  36.0 - 46.0 %   MCV 89.3  78.0 - 100.0 fL   MCH 30.2  26.0 - 34.0 pg   MCHC 33.8  30.0 - 36.0 g/dL   RDW 12.4  11.5 - 15.5 %   Platelets 223  150 - 400 K/uL  HCG, SERUM, QUALITATIVE     Status: None   Collection Time    10/31/13 12:36 PM      Result Value Ref Range   Preg, Serum NEGATIVE  NEGATIVE   Comment:            THE SENSITIVITY OF THIS     METHODOLOGY IS  >10 mIU/mL.   No results found.  Review of Systems  All other systems reviewed and are negative.   There were no vitals taken for this visit. Physical Exam  Constitutional: She is oriented to person, place, and time. She appears well-developed and well-nourished.  HENT:  Head: Normocephalic and atraumatic.  Mouth/Throat: No oropharyngeal exudate.  Eyes: Pupils are equal, round, and reactive to light.  Neck: Normal range of motion. No tracheal deviation present.  Cardiovascular: Normal rate, regular rhythm, normal heart sounds and intact distal pulses.   Respiratory: Effort normal and breath sounds normal. No respiratory distress.  GI: Soft. Bowel sounds are normal.  Musculoskeletal: Normal range of motion. She exhibits no edema.  Lymphadenopathy:    She has no cervical adenopathy.  Neurological: She is alert and oriented to person, place, and time.  Skin: Skin is warm and dry.   breast: There is a well-healed incision around the areola of the right breast. There is a small hematoma at the biopsy site  Assessment/Plan Right breast papilloma  I discussed this with the  patient in detail. Right breast lumpectomy is recommended to both remove the papilloma and the second lesion. I discussed the risks of surgery which includes but is not limited to bleeding, infection, need for further surgery if malignancy is present, missing the second lesion need for further surgery, etc. She understands and wished to proceed. As the area is pretty evident underneath the edge of the areola, I will hold all needle localization preoperatively.  Tyton Abdallah A 11/01/2013, 12:49 PM

## 2013-11-02 ENCOUNTER — Encounter (HOSPITAL_COMMUNITY): Payer: Self-pay | Admitting: *Deleted

## 2013-11-02 ENCOUNTER — Encounter (HOSPITAL_COMMUNITY): Payer: BC Managed Care – PPO | Admitting: Certified Registered Nurse Anesthetist

## 2013-11-02 ENCOUNTER — Ambulatory Visit (HOSPITAL_COMMUNITY): Payer: BC Managed Care – PPO | Admitting: Certified Registered Nurse Anesthetist

## 2013-11-02 ENCOUNTER — Encounter (HOSPITAL_COMMUNITY)
Admission: RE | Disposition: A | Discharge status: Home / Self Care | Payer: Self-pay | Source: Ambulatory Visit | Attending: Surgery

## 2013-11-02 ENCOUNTER — Encounter (INDEPENDENT_AMBULATORY_CARE_PROVIDER_SITE_OTHER): Payer: Self-pay | Admitting: General Surgery

## 2013-11-02 ENCOUNTER — Ambulatory Visit (HOSPITAL_COMMUNITY)
Admission: RE | Admit: 2013-11-02 | Discharge: 2013-11-02 | Disposition: A | Discharge status: Home / Self Care | Payer: BC Managed Care – PPO | Source: Ambulatory Visit | Attending: Surgery | Admitting: Surgery

## 2013-11-02 DIAGNOSIS — K219 Gastro-esophageal reflux disease without esophagitis: Secondary | ICD-10-CM | POA: Insufficient documentation

## 2013-11-02 DIAGNOSIS — G44209 Tension-type headache, unspecified, not intractable: Secondary | ICD-10-CM | POA: Insufficient documentation

## 2013-11-02 DIAGNOSIS — K449 Diaphragmatic hernia without obstruction or gangrene: Secondary | ICD-10-CM | POA: Insufficient documentation

## 2013-11-02 DIAGNOSIS — Z01812 Encounter for preprocedural laboratory examination: Secondary | ICD-10-CM | POA: Insufficient documentation

## 2013-11-02 DIAGNOSIS — D249 Benign neoplasm of unspecified breast: Secondary | ICD-10-CM | POA: Insufficient documentation

## 2013-11-02 HISTORY — PX: BREAST LUMPECTOMY: SHX2

## 2013-11-02 SURGERY — BREAST LUMPECTOMY
Anesthesia: General | Site: Breast | Laterality: Right

## 2013-11-02 MED ORDER — METOCLOPRAMIDE HCL 5 MG/ML IJ SOLN: 10.0000 mg | Freq: Once | INTRAMUSCULAR | Status: DC | PRN

## 2013-11-02 MED ORDER — FENTANYL CITRATE 0.05 MG/ML IJ SOLN
INTRAMUSCULAR | Status: DC | PRN
  Administered 2013-11-02 (×2): 50 ug via INTRAVENOUS

## 2013-11-02 MED ORDER — DEXAMETHASONE SODIUM PHOSPHATE 4 MG/ML IJ SOLN
INTRAMUSCULAR | Status: DC | PRN
  Administered 2013-11-02: 4 mg via INTRAVENOUS

## 2013-11-02 MED ORDER — MIDAZOLAM HCL 2 MG/2ML IJ SOLN
INTRAMUSCULAR | Status: AC
  Filled 2013-11-02: qty 2

## 2013-11-02 MED ORDER — PROPOFOL 10 MG/ML IV BOLUS
INTRAVENOUS | Status: AC
  Filled 2013-11-02: qty 20

## 2013-11-02 MED ORDER — ONDANSETRON HCL 4 MG/2ML IJ SOLN
INTRAMUSCULAR | Status: DC | PRN
  Administered 2013-11-02: 4 mg via INTRAVENOUS

## 2013-11-02 MED ORDER — FENTANYL CITRATE 0.05 MG/ML IJ SOLN
INTRAMUSCULAR | Status: AC
  Filled 2013-11-02: qty 5

## 2013-11-02 MED ORDER — LIDOCAINE HCL (CARDIAC) 20 MG/ML IV SOLN
INTRAVENOUS | Status: DC | PRN
  Administered 2013-11-02: 60 mg via INTRAVENOUS

## 2013-11-02 MED ORDER — MIDAZOLAM HCL 5 MG/5ML IJ SOLN
INTRAMUSCULAR | Status: DC | PRN
  Administered 2013-11-02: 2 mg via INTRAVENOUS

## 2013-11-02 MED ORDER — 0.9 % SODIUM CHLORIDE (POUR BTL) OPTIME
TOPICAL | Status: DC | PRN
  Administered 2013-11-02: 1000 mL

## 2013-11-02 MED ORDER — LACTATED RINGERS IV SOLN
INTRAVENOUS | Status: DC | PRN
  Administered 2013-11-02: 10:00:00 via INTRAVENOUS

## 2013-11-02 MED ORDER — OXYCODONE HCL 5 MG/5ML PO SOLN: 5.0000 mg | Freq: Once | ORAL | Status: DC | PRN

## 2013-11-02 MED ORDER — BUPIVACAINE-EPINEPHRINE 0.25% -1:200000 IJ SOLN
INTRAMUSCULAR | Status: DC | PRN
  Administered 2013-11-02: 30 mL

## 2013-11-02 MED ORDER — OXYCODONE-ACETAMINOPHEN 5-325 MG PO TABS: 1.0000 | ORAL_TABLET | ORAL | Status: DC | PRN

## 2013-11-02 MED ORDER — LACTATED RINGERS IV SOLN
INTRAVENOUS | Status: DC
  Administered 2013-11-02: 10:00:00 via INTRAVENOUS

## 2013-11-02 MED ORDER — HYDROMORPHONE HCL PF 1 MG/ML IJ SOLN: 0.2500 mg | INTRAMUSCULAR | Status: DC | PRN

## 2013-11-02 MED ORDER — PROPOFOL 10 MG/ML IV BOLUS
INTRAVENOUS | Status: DC | PRN
  Administered 2013-11-02: 150 mg via INTRAVENOUS

## 2013-11-02 MED ORDER — OXYCODONE HCL 5 MG PO TABS: 5.0000 mg | ORAL_TABLET | Freq: Once | ORAL | Status: DC | PRN

## 2013-11-02 MED ORDER — BUPIVACAINE-EPINEPHRINE (PF) 0.25% -1:200000 IJ SOLN
INTRAMUSCULAR | Status: AC
  Filled 2013-11-02: qty 30

## 2013-11-02 SURGICAL SUPPLY — 55 items
ADH SKN CLS APL DERMABOND .7 (GAUZE/BANDAGES/DRESSINGS) ×1
APL SKNCLS STERI-STRIP NONHPOA (GAUZE/BANDAGES/DRESSINGS) ×1
BENZOIN TINCTURE PRP APPL 2/3 (GAUZE/BANDAGES/DRESSINGS) ×1 IMPLANT
BINDER BREAST LRG (GAUZE/BANDAGES/DRESSINGS) IMPLANT
BINDER BREAST XLRG (GAUZE/BANDAGES/DRESSINGS) IMPLANT
BLADE SURG 10 STRL SS (BLADE) ×1 IMPLANT
BLADE SURG 11 STRL SS (BLADE) ×1 IMPLANT
BLADE SURG 15 STRL LF DISP TIS (BLADE) IMPLANT
BLADE SURG 15 STRL SS (BLADE) ×2
CANISTER SUCTION 2500CC (MISCELLANEOUS) IMPLANT
CHLORAPREP W/TINT 26ML (MISCELLANEOUS) ×2 IMPLANT
CONT SPEC 4OZ CLIKSEAL STRL BL (MISCELLANEOUS) IMPLANT
COVER SURGICAL LIGHT HANDLE (MISCELLANEOUS) ×2 IMPLANT
DECANTER SPIKE VIAL GLASS SM (MISCELLANEOUS) ×2 IMPLANT
DERMABOND ADVANCED (GAUZE/BANDAGES/DRESSINGS) ×1
DERMABOND ADVANCED .7 DNX12 (GAUZE/BANDAGES/DRESSINGS) ×1 IMPLANT
DEVICE DUBIN SPECIMEN MAMMOGRA (MISCELLANEOUS) IMPLANT
DRAPE PED LAPAROTOMY (DRAPES) ×2 IMPLANT
DRAPE UTILITY 15X26 W/TAPE STR (DRAPE) ×4 IMPLANT
ELECT CAUTERY BLADE 6.4 (BLADE) ×2 IMPLANT
ELECT REM PT RETURN 9FT ADLT (ELECTROSURGICAL) ×2
ELECTRODE REM PT RTRN 9FT ADLT (ELECTROSURGICAL) ×1 IMPLANT
GAUZE SPONGE 4X4 16PLY XRAY LF (GAUZE/BANDAGES/DRESSINGS) ×2 IMPLANT
GLOVE BIO SURGEON STRL SZ 6.5 (GLOVE) ×1 IMPLANT
GLOVE BIOGEL PI IND STRL 6.5 (GLOVE) IMPLANT
GLOVE BIOGEL PI IND STRL 7.0 (GLOVE) IMPLANT
GLOVE BIOGEL PI INDICATOR 6.5 (GLOVE) ×2
GLOVE BIOGEL PI INDICATOR 7.0 (GLOVE) ×1
GLOVE ECLIPSE 6.5 STRL STRAW (GLOVE) ×1 IMPLANT
GLOVE SURG SIGNA 7.5 PF LTX (GLOVE) ×2 IMPLANT
GOWN STRL REUS W/ TWL LRG LVL3 (GOWN DISPOSABLE) ×1 IMPLANT
GOWN STRL REUS W/ TWL XL LVL3 (GOWN DISPOSABLE) ×1 IMPLANT
GOWN STRL REUS W/TWL LRG LVL3 (GOWN DISPOSABLE) ×4
GOWN STRL REUS W/TWL XL LVL3 (GOWN DISPOSABLE) ×2
KIT BASIN OR (CUSTOM PROCEDURE TRAY) ×2 IMPLANT
KIT MARKER MARGIN INK (KITS) IMPLANT
KIT ROOM TURNOVER OR (KITS) ×2 IMPLANT
NDL HYPO 25GX1X1/2 BEV (NEEDLE) ×1 IMPLANT
NEEDLE HYPO 25GX1X1/2 BEV (NEEDLE) ×2 IMPLANT
NS IRRIG 1000ML POUR BTL (IV SOLUTION) ×2 IMPLANT
PACK SURGICAL SETUP 50X90 (CUSTOM PROCEDURE TRAY) ×2 IMPLANT
PAD ARMBOARD 7.5X6 YLW CONV (MISCELLANEOUS) ×2 IMPLANT
PENCIL BUTTON HOLSTER BLD 10FT (ELECTRODE) ×2 IMPLANT
SPONGE GAUZE 4X4 12PLY STER LF (GAUZE/BANDAGES/DRESSINGS) ×1 IMPLANT
STRIP CLOSURE SKIN 1/2X4 (GAUZE/BANDAGES/DRESSINGS) ×1 IMPLANT
SUT MNCRL AB 4-0 PS2 18 (SUTURE) ×2 IMPLANT
SUT VIC AB 3-0 SH 27 (SUTURE) ×2
SUT VIC AB 3-0 SH 27X BRD (SUTURE) ×1 IMPLANT
SYR BULB 3OZ (MISCELLANEOUS) ×2 IMPLANT
SYR CONTROL 10ML LL (SYRINGE) ×2 IMPLANT
TAPE CLOTH SURG 4X10 WHT LF (GAUZE/BANDAGES/DRESSINGS) ×1 IMPLANT
TOWEL OR 17X24 6PK STRL BLUE (TOWEL DISPOSABLE) ×2 IMPLANT
TOWEL OR 17X26 10 PK STRL BLUE (TOWEL DISPOSABLE) ×2 IMPLANT
TUBE CONNECTING 12X1/4 (SUCTIONS) IMPLANT
YANKAUER SUCT BULB TIP NO VENT (SUCTIONS) IMPLANT

## 2013-11-02 NOTE — Op Note (Signed)
RIGHT BREAST LUMPECTOMY  Procedure Note  Deanna Carter 11/02/2013   Pre-op Diagnosis: right breast mass     Post-op Diagnosis: same  Procedure(s): RIGHT BREAST LUMPECTOMY  Surgeon(s): Harl Bowie, MD  Anesthesia: General  Staff:  Circulator: Eloise Harman, RN Scrub Person: Nicki Guadalajara, CST  Estimated Blood Loss: Minimal               Specimens: sent to path          [facility]   Date: 11/02/2013  Time: 11:30 AM

## 2013-11-02 NOTE — Discharge Instructions (Signed)
[facility] °Office Phone Number [phone] ° °BREAST BIOPSY/ PARTIAL MASTECTOMY: POST OP INSTRUCTIONS ° °Always review your discharge instruction sheet given to you by the facility where your surgery was performed. ° °IF YOU HAVE DISABILITY OR FAMILY LEAVE FORMS, YOU MUST BRING THEM TO THE OFFICE FOR PROCESSING.  DO NOT GIVE THEM TO YOUR DOCTOR. ° °1. A prescription for pain medication may be given to you upon discharge.  Take your pain medication as prescribed, if needed.  If narcotic pain medicine is not needed, then you may take acetaminophen (Tylenol) or ibuprofen (Advil) as needed. °2. Take your usually prescribed medications unless otherwise directed °3. If you need a refill on your pain medication, please contact your pharmacy.  They will contact our office to request authorization.  Prescriptions will not be filled after 5pm or on week-ends. °4. You should eat very light the first 24 hours after surgery, such as soup, crackers, pudding, etc.  Resume your normal diet the day after surgery. °5. Most patients will experience some swelling and bruising in the breast.  Ice packs and a good support bra will help.  Swelling and bruising can take several days to resolve.  °6. It is common to experience some constipation if taking pain medication after surgery.  Increasing fluid intake and taking a stool softener will usually help or prevent this problem from occurring.  A mild laxative (Milk of Magnesia or Miralax) should be taken according to package directions if there are no bowel movements after 48 hours. °7. Unless discharge instructions indicate otherwise, you may remove your bandages 24-48 hours after surgery, and you may shower at that time.  You may have steri-strips (small skin tapes) in place directly over the incision.  These strips should be left on the skin for 7-10 days.  If your surgeon used skin glue on the incision, you may shower in 24 hours.  The glue will flake off over the  next 2-3 weeks.  Any sutures or staples will be removed at the office during your follow-up visit. °8. ACTIVITIES:  You may resume regular daily activities (gradually increasing) beginning the next day.  Wearing a good support bra or sports bra minimizes pain and swelling.  You may have sexual intercourse when it is comfortable. °a. You may drive when you no longer are taking prescription pain medication, you can comfortably wear a seatbelt, and you can safely maneuver your car and apply brakes. °b. RETURN TO WORK:  ______________________________________________________________________________________ °9. You should see your doctor in the office for a follow-up appointment approximately two weeks after your surgery.  Your doctor’s nurse will typically make your follow-up appointment when she calls you with your pathology report.  Expect your pathology report 2-3 business days after your surgery.  You may call to check if you do not hear from us after three days. °10. OTHER INSTRUCTIONS: _______________________________________________________________________________________________ _____________________________________________________________________________________________________________________________________ °_____________________________________________________________________________________________________________________________________ °_____________________________________________________________________________________________________________________________________ ° °WHEN TO CALL YOUR DOCTOR: °1. Fever over 101.0 °2. Nausea and/or vomiting. °3. Extreme swelling or bruising. °4. Continued bleeding from incision. °5. Increased pain, redness, or drainage from the incision. ° °The clinic staff is available to answer your questions during regular business hours.  Please don’t hesitate to call and ask to speak to one of the nurses for clinical concerns.  If you have a medical emergency, go to the nearest  emergency room or call 911.  A surgeon from [facility] is always on call at the hospital. ° °For further questions, please visit [email or web address]  ° ° °  What to eat:  For your first meals, you should eat lightly; only small meals initially.  If you do not have nausea, you may eat larger meals.  Avoid spicy, greasy and heavy food.    General Anesthesia, Adult, Care After  Refer to this sheet in the next few weeks. These instructions provide you with information on caring for yourself after your procedure. Your health care provider may also give you more specific instructions. Your treatment has been planned according to current medical practices, but problems sometimes occur. Call your health care provider if you have any problems or questions after your procedure.  WHAT TO EXPECT AFTER THE PROCEDURE  After the procedure, it is typical to experience:  Sleepiness.  Nausea and vomiting. HOME CARE INSTRUCTIONS  For the first 24 hours after general anesthesia:  Have a responsible person with you.  Do not drive a car. If you are alone, do not take public transportation.  Do not drink alcohol.  Do not take medicine that has not been prescribed by your health care provider.  Do not sign important papers or make important decisions.  You may resume a normal diet and activities as directed by your health care provider.  Change bandages (dressings) as directed.  If you have questions or problems that seem related to general anesthesia, call the hospital and ask for the anesthetist or anesthesiologist on call. SEEK MEDICAL CARE IF:  You have nausea and vomiting that continue the day after anesthesia.  You develop a rash. SEEK IMMEDIATE MEDICAL CARE IF:  You have difficulty breathing.  You have chest pain.  You have any allergic problems. Document Released: 08/10/2000 Document Revised: 01/04/2013 Document Reviewed: 11/17/2012  [facility] Patient Information 2014 [place], [place].

## 2013-11-02 NOTE — Anesthesia Postprocedure Evaluation (Signed)
Anesthesia Post Note  Patient: Deanna Carter  Procedure(s) Performed: Procedure(s) (LRB): RIGHT BREAST LUMPECTOMY (Right)  Anesthesia type: General  Patient location: PACU  Post pain: Pain level controlled  Post assessment: Patient's Cardiovascular Status Stable  Last Vitals:  Filed Vitals:   11/02/13 1145  BP:   Pulse: 80  Temp:   Resp: 17    Post vital signs: Reviewed and stable  Level of consciousness: alert  Complications: No apparent anesthesia complications

## 2013-11-02 NOTE — Transfer of Care (Signed)
Immediate Anesthesia Transfer of Care Note  Patient: Deanna Carter  Procedure(s) Performed: Procedure(s): RIGHT BREAST LUMPECTOMY (Right)  Patient Location: PACU  Anesthesia Type:General  Level of Consciousness: awake, alert  and oriented  Airway & Oxygen Therapy: Patient Spontanous Breathing  Post-op Assessment: Report given to PACU RN  Post vital signs: Reviewed and stable  Complications: No apparent anesthesia complications

## 2013-11-02 NOTE — Interval H&P Note (Signed)
History and Physical Interval Note: no change in H and P  11/02/2013 10:31 AM  Deanna Carter  has presented today for surgery, with the diagnosis of right breast mass  The various methods of treatment have been discussed with the patient and family. After consideration of risks, benefits and other options for treatment, the patient has consented to  Procedure(s): RIGHT BREAST LUMPECTOMY (Right) as a surgical intervention .  The patient's history has been reviewed, patient examined, no change in status, stable for surgery.  I have reviewed the patient's chart and labs.  Questions were answered to the patient's satisfaction.     BLACKMAN,DOUGLAS A

## 2013-11-02 NOTE — Anesthesia Preprocedure Evaluation (Addendum)
Anesthesia Evaluation  Patient identified by MRN, date of birth, ID band Patient awake    Reviewed: Allergy & Precautions, H&P , NPO status , Patient's Chart, lab work & pertinent test results, reviewed documented beta blocker date and time   Airway Mallampati: II TM Distance: >3 FB Neck ROM: Full    Dental  (+) Teeth Intact   Pulmonary neg pulmonary ROS,  breath sounds clear to auscultation        Cardiovascular negative cardio ROS  Rhythm:Regular Rate:Normal     Neuro/Psych  Headaches, negative psych ROS   GI/Hepatic Neg liver ROS, hiatal hernia, GERD-  Medicated and Controlled,  Endo/Other  negative endocrine ROS  Renal/GU negative Renal ROS  negative genitourinary   Musculoskeletal   Abdominal (+)  Abdomen: soft.    Peds  Hematology negative hematology ROS (+)   Anesthesia Other Findings See surgeon's H&P   Reproductive/Obstetrics negative OB ROS                          Anesthesia Physical Anesthesia Plan  ASA: II  Anesthesia Plan: General   Post-op Pain Management:    Induction: Intravenous  Airway Management Planned: LMA  Additional Equipment:   Intra-op Plan:   Post-operative Plan: Extubation in OR  Informed Consent: I have reviewed the patients History and Physical, chart, labs and discussed the procedure including the risks, benefits and alternatives for the proposed anesthesia with the patient or authorized representative who has indicated his/her understanding and acceptance.   Dental advisory given  Plan Discussed with: Anesthesiologist and Surgeon  Anesthesia Plan Comments:        Anesthesia Quick Evaluation

## 2013-11-03 NOTE — Op Note (Signed)
NAMESIRENITY, SHEW            ACCOUNT NO.:  [id number]  MEDICAL RECORD NO.:  [id number]  LOCATION:                                 FACILITY:  PHYSICIAN:  Coralie Keens, M.D. DATE OF BIRTH:  1966-02-23  DATE OF PROCEDURE:  11/02/2013 DATE OF DISCHARGE:  11/02/2013                              OPERATIVE REPORT   PREOPERATIVE DIAGNOSIS:  Right breast papilloma.  POSTOPERATIVE DIAGNOSIS:  Right breast papilloma.  PROCEDURE:  Right breast lumpectomy.  SURGEON:  Coralie Keens, M.D.  ANESTHESIA:  General and 0.25% Marcaine.  ESTIMATED BLOOD LOSS:  Minimal.  INDICATIONS:  This is a 48 year old female who has had a previous papilloma of the right breast.  She now presents with 2 small masses at the 10 o'clock position of the right breast.  Biopsy of 1 mass confirmed a papilloma.  The second mass was obscured by hematoma after the first biopsy, so cannot be biopsied.  Decision was made to proceed with lumpectomy.  PROCEDURE IN DETAIL:  The patient was brought to the operating room, identified as Latica Pokorski.  She was placed supine on the operating room table and general anesthesia was induced.  Her right breast then prepped and draped in usual sterile fashion.  I anesthetized skin along the outer edge of the areola with Marcaine.  I then made a circumareolar incision at the 9 to 10 o'clock position with a scalpel.  I took this down to the breast tissue with electrocautery.  I then performed a wide lumpectomy with the cautery, incorporating all of the tissue underneath the areola at the 9 to 10 o'clock position.  I could identify the small hematoma from the previous biopsy.  I went wildly around this for approximately 4 cm in all direction.  I felt like large margins were obtained around the hematoma.  The second mass was only 5 mm away based on mammography and I believe this was incorporated in the lumpectomy specimen.  Once the specimen was removed, it was sent to  Pathology for evaluation.  I then irrigated the wound with saline.  I palpated the surrounding breast tissue and found no other abnormalities.  At this point, I anesthetized the wound further with Marcaine.  I then closed subcutaneous tissue with interrupted 3-0 Vicryl sutures and closed the skin with a running 4-0 Monocryl.  Steri-Strips, gauze, and Tegaderm were then applied.  The patient tolerated the procedure well.  All the counts were correct at the end of procedure. The patient was then extubated in the operating room and taken in stable condition to recovery room.     Coralie Keens, M.D.     DB/MEDQ  D:  11/02/2013  T:  11/02/2013  Job:  [id number]

## 2013-11-05 ENCOUNTER — Encounter (HOSPITAL_COMMUNITY): Payer: Self-pay | Admitting: Surgery

## 2013-11-13 ENCOUNTER — Ambulatory Visit (INDEPENDENT_AMBULATORY_CARE_PROVIDER_SITE_OTHER): Payer: BC Managed Care – PPO | Admitting: Surgery

## 2013-11-13 ENCOUNTER — Encounter (INDEPENDENT_AMBULATORY_CARE_PROVIDER_SITE_OTHER): Payer: Self-pay | Admitting: Surgery

## 2013-11-13 VITALS — BP 124/82 | HR 70 | Temp 97.5°F | Ht 67.0 in | Wt 165.0 lb

## 2013-11-13 DIAGNOSIS — Z09 Encounter for follow-up examination after completed treatment for conditions other than malignant neoplasm: Secondary | ICD-10-CM

## 2013-11-13 NOTE — Progress Notes (Signed)
Subjective:     Patient ID: Deanna Carter, female   DOB: [birth year], 48 y.o.   MRN: [id number]  HPI She is here for first postop visit status post right breast lumpectomy for papillomas.  She is doing well has no complaints  Review of Systems     Objective:   Physical Exam On exam, the incision is healing well but there is some ecchymosis. The final pathology confirmed one papilloma.    Assessment:     Patient stable postop     Plan:     I removed greater than 5 cm circumferentially around the biopsy site. I believe the papilloma was removed and the lesion is probably an exact lesion papilloma.I will see her back in 6 months and determine whether we need order another ultrasound.

## 2014-03-01 ENCOUNTER — Other Ambulatory Visit: Payer: Self-pay | Admitting: Obstetrics and Gynecology

## 2014-03-02 LAB — CYTOLOGY - PAP

## 2014-03-05 ENCOUNTER — Other Ambulatory Visit: Payer: Self-pay | Admitting: Obstetrics and Gynecology

## 2014-03-05 DIAGNOSIS — N6452 Nipple discharge: Secondary | ICD-10-CM

## 2014-03-07 ENCOUNTER — Telehealth: Payer: Self-pay | Admitting: *Deleted

## 2014-03-07 NOTE — Telephone Encounter (Signed)
Spoke to patient and she is aware that she will be seeing mm on 11-3 due to cm template change. Patient wants to go back to cm after this visit.

## 2014-03-13 ENCOUNTER — Other Ambulatory Visit: Payer: BC Managed Care – PPO

## 2014-03-14 ENCOUNTER — Ambulatory Visit
Admission: RE | Admit: 2014-03-14 | Discharge: 2014-03-14 | Disposition: A | Discharge status: Home / Self Care | Payer: BC Managed Care – PPO | Source: Ambulatory Visit | Attending: Obstetrics and Gynecology | Admitting: Obstetrics and Gynecology

## 2014-03-14 DIAGNOSIS — N6452 Nipple discharge: Secondary | ICD-10-CM

## 2014-03-15 NOTE — Telephone Encounter (Signed)
Patient scheduled.

## 2014-03-20 ENCOUNTER — Encounter: Payer: Self-pay | Admitting: Adult Health

## 2014-03-20 ENCOUNTER — Ambulatory Visit (INDEPENDENT_AMBULATORY_CARE_PROVIDER_SITE_OTHER): Payer: BC Managed Care – PPO | Admitting: Adult Health

## 2014-03-20 VITALS — BP 112/71 | HR 59 | Ht 67.0 in | Wt 168.0 lb

## 2014-03-20 DIAGNOSIS — G43009 Migraine without aura, not intractable, without status migrainosus: Secondary | ICD-10-CM

## 2014-03-20 NOTE — Patient Instructions (Signed)

## 2014-03-20 NOTE — Progress Notes (Signed)
PATIENT: Deanna Carter DOB: 11-10-1965  REASON FOR VISIT: follow up HISTORY FROM: patient  HISTORY OF PRESENT ILLNESS: Deanna Carter is a 48 year old female with a history of migraines. She returns today for follow-up. She currently uses imitrex  and reports that they work well for her. Each month varies depending on the weather. She states that her migraines her triggered by the weather. She states that last week she had several migraines. Her migraines are usually located be hind the left ear. She does have nausea but no vomiting with her migraines. + photophobia but denies phonophobia.No new neurological complaints. She reports that since the last visit she had a lumpectomy in June. The patho report showed that it was benign.   HISTORY 09/14/13 (CM): 48 year old female returns for followup. She has a history of migraine headaches for about 13 years respond well to Imitrex. She has never been able to figure out migraine triggers except weather changes and sometimes stress. She is not aware of any foods that cause problems .She denies visual changes lateralized motor or sensory deficits in between episodes, headaches always starts left retro-orbital area associated with noise and light sensitivity can last several days but this does not happen frequently. She is having one to 2 a month. She returns for reevaluation HISTORY: She has a history of migraine headache for more than 12 years, only happened occasionally, responded very well to Imitrex injection, she has been doing very well for many years, began to develop frequent prolonged, severe migraine headaches again over the past year. Couple times a week, she has mild lateralized headaches, over past 6 months, she had 2 severe migraine headaches, left retro-orbital area severe pounding headache, with associated light noise sensitivity, lasting for 2-3 days, over-the-counter medicine would not help. She denies visual change, lateralized motor or  sensory deficit in between episodes,  Trigger for migraines a bariatric pressure changes, stress CT sinus was normal  REVIEW OF SYSTEMS: Full 14 system review of systems performed and notable only for:  Constitutional: N/A  Eyes: N/A Ear/Nose/Throat: N/A  Skin: N/A  Cardiovascular: N/A  Respiratory: N/A  Gastrointestinal: N/A  Genitourinary: N/A Hematology/Lymphatic: N/A  Endocrine: N/A Musculoskeletal:N/A  Allergy/Immunology: N/A  Neurological: headache Psychiatric: N/A Sleep: N/A   ALLERGIES: No Known Allergies  HOME MEDICATIONS: Outpatient Prescriptions Prior to Visit  Medication Sig Dispense Refill  . acetaminophen (TYLENOL) 500 MG tablet Take 1,000 mg by mouth daily as needed for headache. For pain    . ACZONE 5 % topical gel Apply 1 application topically 2 (two) times daily. For face    . Ascorbic Acid (VITAMIN C PO) Take 1 tablet by mouth 2 (two) times daily.     Marland Kitchen azelastine (ASTELIN) 0.1 % nasal spray Place 1 spray into both nostrils every other day.     . calcium carbonate (OS-CAL) 600 MG TABS tablet Take 600 mg by mouth 2 (two) times daily with a meal.    . cycloSPORINE (RESTASIS) 0.05 % ophthalmic emulsion Place 1 drop into both eyes daily.     Marland Kitchen ibuprofen (ADVIL,MOTRIN) 200 MG tablet Take 600 mg by mouth daily as needed (for headaches). For pain    . loratadine (CLARITIN) 10 MG tablet Take 10 mg by mouth daily.    . mometasone (NASONEX) 50 MCG/ACT nasal spray Place 1 spray into the nose 2 (two) times daily.    Lenard Forth Triphasic (ORTHO TRI-CYCLEN, 28, PO) Take 1 tablet by mouth daily. Pt taking  the generic version of this medication.    Marland Kitchen olopatadine (PATANOL) 0.1 % ophthalmic solution Place 1 drop into both eyes 2 (two) times daily.     Marland Kitchen omeprazole (PRILOSEC) 20 MG capsule Take 20 mg by mouth every morning.     Marland Kitchen OVER THE COUNTER MEDICATION Take 1 packet by mouth daily as needed (for headaches). Goodies Powder as needed for pain    . OVER THE  COUNTER MEDICATION Take 1 capsule by mouth 2 (two) times daily. Bio Tears    . oxyCODONE-acetaminophen (ROXICET) 5-325 MG per tablet Take 1-2 tablets by mouth every 4 (four) hours as needed for severe pain. 30 tablet 0  . Probiotic Product (PROBIOTIC DAILY PO) Take 1 tablet by mouth daily.    . SUMAtriptan (IMITREX) 100 MG tablet Take 100 mg by mouth 2 (two) times a week. No more than 2 in 24 hour period    . Tretinoin (RETIN-A EX) Apply 1 application topically every other day. For face    . VITAMIN D, CHOLECALCIFEROL, PO Take 1 tablet by mouth daily.     Marland Kitchen zolpidem (AMBIEN) 10 MG tablet Take 10 mg by mouth at bedtime. For sleep     No facility-administered medications prior to visit.    PAST MEDICAL HISTORY: Past Medical History  Diagnosis Date  . GERD (gastroesophageal reflux disease)   . Generalized headaches     tension - only takes medication as needed to treat  . H/O hiatal hernia     PAST SURGICAL HISTORY: Past Surgical History  Procedure Laterality Date  . Bladder suspension  2005, 2009  . Tubal ligation  2005  . Breast surgery  03/26/2011    Right Breast Mass  . Mastectomy, partial  03/26/2011  . Breast lumpectomy  2012  . Eye surgery Bilateral 1999    lasik  . Breast lumpectomy Right 11/02/2013    Procedure: RIGHT BREAST LUMPECTOMY;  Surgeon: Harl Bowie, MD;  Location: [facility];  Service: General;  Laterality: Right;    FAMILY HISTORY: History reviewed. No pertinent family history.  SOCIAL HISTORY: History   Social History  . Marital Status: Married    Spouse Name: Abe People     Number of Children: 2  . Years of Education: college   Occupational History  .  [employer]   Social History Main Topics  . Smoking status: Never Smoker   . Smokeless tobacco: Never Used  . Alcohol Use: No     Comment: rarely  . Drug Use: No  . Sexual Activity: Yes    Birth Control/ Protection: Pill   Other Topics Concern  . Not on file   Social History  Narrative   Patient lives at home with husband Abe People.    Patient has 2 children.    Patient is currently working.    Patient has her Bachelors    Patient is right handed.      PHYSICAL EXAM  Filed Vitals:   03/20/14 0844  Height: 5\' 7"  (1.702 m)  Weight: 168 lb (76.204 kg)   Body mass index is 26.31 kg/(m^2).  Generalized: Well developed, in no acute distress   Neurological examination  Mentation: Alert oriented to time, place, history taking. Follows all commands speech and language fluent Cranial nerve II-XII: Pupils were equal round reactive to light. Extraocular movements were full, visual field were full on confrontational test. Facial sensation and strength were normal.  Uvula tongue midline. Head turning and shoulder shrug  were normal  and symmetric. Motor: The motor testing reveals 5 over 5 strength of all 4 extremities. Good symmetric motor tone is noted throughout.  Sensory: Sensory testing is intact to soft touch on all 4 extremities. No evidence of extinction is noted.  Coordination: Cerebellar testing reveals good finger-nose-finger and heel-to-shin bilaterally.  Gait and station: Gait is normal. Tandem gait is normal. Romberg is negative. No drift is seen.  Reflexes: Deep tendon reflexes are symmetric and normal bilaterally.    DIAGNOSTIC DATA (LABS, IMAGING, TESTING) - I reviewed patient records, labs, notes, testing and imaging myself where available.  Lab Results  Component Value Date   WBC 7.0 10/31/2013   HGB 12.4 10/31/2013   HCT 36.7 10/31/2013   MCV 89.3 10/31/2013   PLT 223 10/31/2013     ASSESSMENT AND PLAN 48 y.o. year old female  has a past medical history of GERD (gastroesophageal reflux disease); Generalized headaches; and H/O hiatal hernia. here with:  1. Migraines  Migraines are controlled with Imitrex. Continue Imitrex, No refill needed If migraine frequency increases- preventative therapy may be indicated Patient will follow-up in 6  months or sooner if needed.    Ward Givens, MSN, NP-C 03/20/2014, 8:48 AM [facility] [address], [place], [place] [address] [phone]  Note: This document was prepared with digital dictation and possible smart phrase technology. Any transcriptional errors that result from this process are unintentional.

## 2014-04-05 ENCOUNTER — Other Ambulatory Visit (INDEPENDENT_AMBULATORY_CARE_PROVIDER_SITE_OTHER): Payer: Self-pay | Admitting: Surgery

## 2014-04-17 ENCOUNTER — Encounter (HOSPITAL_BASED_OUTPATIENT_CLINIC_OR_DEPARTMENT_OTHER): Payer: Self-pay | Admitting: *Deleted

## 2014-04-17 NOTE — Progress Notes (Signed)
No labs needed

## 2014-04-18 MED ORDER — OXYCODONE HCL 5 MG PO TABS
ORAL_TABLET | ORAL | Status: AC
  Filled 2014-04-18: qty 1

## 2014-04-19 NOTE — H&P (Signed)
Deanna Carter  Location: [facility] Patient #: [id number] DOB: 06/15/1965 Married / Language: English / Race: White Female  History of Present Illness (Brenna Friesenhahn A. Ninfa Linden MD; 04/05/2014 10:17 AM) Patient words: F/U breast.  The patient is a 48 year old female who presents with a breast mass. This is a patient of mine who has had a papilloma removed at 2 separate occasions from her right breast. The last was in June of this year. She started having nipple drainage again now has an ultrasound showing a probable papilloma in a separate area of the right breast. She is otherwise without complaints   Other Problems Erasmo Leventhal, RN, BSN; 04/05/2014 9:54 AM) Gastroesophageal Reflux Disease Migraine Headache  Past Surgical History Erasmo Leventhal, RN, BSN; 04/05/2014 9:54 AM) Breast Mass; Local Excision Right.  Diagnostic Studies History Erasmo Leventhal, RN, BSN; 04/05/2014 9:54 AM) Colonoscopy never Mammogram within last year Pap Smear 1-5 years ago  Allergies Erasmo Leventhal, RN, BSN; 04/05/2014 9:56 AM) No Known Drug Allergies11/19/2015  Medication History (Erasmo Leventhal, RN, BSN; 04/05/2014 10:01 AM) Zolpidem Tartrate ER (12.5MG  Tablet ER, Oral as needed) Active. Azelastine HCl (0.1% Solution, Nasal as needed) Active. Nasonex (50MCG/ACT Suspension, Nasal as needed) Active. Patanol (0.1% Solution, Ophthalmic daily) Active. Restasis (0.05% Emulsion, Ophthalmic daily) Active. SUMAtriptan Succinate (100MG  Tablet, Oral as needed) Active. Aczone (5% Gel, External as needed) Active. Vitamin C (1000MG  Tablet, Oral as needed) Active. Os-Cal 250 + D (250-125MG -UNIT Tablet, Oral daily) Active. Loratadine Allergy Relief (10MG  Tablet Disperse, Oral as needed) Active. PriLOSEC (20MG  Capsule DR, Oral daily) Active. Imitrex (100MG  Tablet, Oral as needed) Active. Ambien (10MG  Tablet, Oral as needed) Active. Medications Reconciled  Social  History (Erasmo Leventhal, RN, BSN; 04/05/2014 9:54 AM) Alcohol use Occasional alcohol use. Caffeine use Carbonated beverages, Coffee. No drug use Tobacco use Never smoker.  Family History (Erasmo Leventhal, RN, BSN; 04/05/2014 9:54 AM) Depression Father, Mother.  Pregnancy / Birth History Erasmo Leventhal, RN, BSN; 04/05/2014 9:54 AM) Age at menarche 52 years. Contraceptive History Oral contraceptives. Gravida 2 Maternal age 80-30 Para 2 Regular periods  Review of Systems Occupational hygienist, BSN; 04/05/2014 9:54 AM) General Not Present- Appetite Loss, Chills, Fatigue, Fever, Night Sweats, Weight Gain and Weight Loss. Skin Not Present- Change in Wart/Mole, Dryness, Hives, Jaundice, New Lesions, Non-Healing Wounds, Rash and Ulcer. HEENT Not Present- Earache, Hearing Loss, Hoarseness, Nose Bleed, Oral Ulcers, Ringing in the Ears, Seasonal Allergies, Sinus Pain, Sore Throat, Visual Disturbances, Wears glasses/contact lenses and Yellow Eyes. Respiratory Not Present- Bloody sputum, Chronic Cough, Difficulty Breathing, Snoring and Wheezing. Cardiovascular Not Present- Chest Pain, Difficulty Breathing Lying Down, Leg Cramps, Palpitations, Rapid Heart Rate, Shortness of Breath and Swelling of Extremities. Gastrointestinal Not Present- Abdominal Pain, Bloating, Bloody Stool, Change in Bowel Habits, Chronic diarrhea, Constipation, Difficulty Swallowing, Excessive gas, Gets full quickly at meals, Hemorrhoids, Indigestion, Nausea, Rectal Pain and Vomiting. Female Genitourinary Not Present- Frequency, Nocturia, Painful Urination, Pelvic Pain and Urgency. Musculoskeletal Not Present- Back Pain, Joint Pain, Joint Stiffness, Muscle Pain, Muscle Weakness and Swelling of Extremities. Neurological Present- Headaches. Not Present- Decreased Memory, Fainting, Numbness, Seizures, Tingling, Tremor, Trouble walking and Weakness. Psychiatric Not Present- Anxiety, Bipolar, Change in Sleep Pattern,  Depression, Fearful and Frequent crying. Endocrine Not Present- Cold Intolerance, Excessive Hunger, Hair Changes, Heat Intolerance, Hot flashes and New Diabetes. Hematology Not Present- Easy Bruising, Excessive bleeding, Gland problems, HIV and Persistent Infections.   Vitals Occupational hygienist, BSN; 04/05/2014 9:56 AM) 04/05/2014 9:55 AM Weight: 167.2 lb Height: 68in  Body Surface Area: 1.91 m Body Mass Index: 25.42 kg/m Temp.: 98.60F(Oral)  Pulse: 78 (Regular)  Resp.: 20 (Unlabored)  BP: 120/80 (Sitting, Left Arm, Standard)    Physical Exam (Tonetta Napoles A. Ninfa Linden MD; 04/05/2014 10:18 AM) The physical exam findings are as follows: Note:On exam, there are no palpable masses in the right breast. Her incision remains well healed from her previous surgeries Lungs CTA bilaterally CV RRR Abdomen soft, non tender  I have reviewed her ultrasound which shows a probable benign right breast papilloma at the 4 o'clock position which measures 7 mm x 3 mm x 4 mm    Assessment & Plan (Kennidy Lamke A. 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 Will attempt a central duct excision. I discussed this with her in detail including the risks. These risks include but are not limited to bleeding, infection, need for further surgery if the mass is missed or there is malignancy, etc. She understands and wishes to proceed

## 2014-04-20 ENCOUNTER — Encounter (HOSPITAL_BASED_OUTPATIENT_CLINIC_OR_DEPARTMENT_OTHER)
Admission: RE | Disposition: A | Discharge status: Home / Self Care | Payer: Self-pay | Source: Ambulatory Visit | Attending: Surgery

## 2014-04-20 ENCOUNTER — Ambulatory Visit (HOSPITAL_BASED_OUTPATIENT_CLINIC_OR_DEPARTMENT_OTHER): Payer: BC Managed Care – PPO | Admitting: Anesthesiology

## 2014-04-20 ENCOUNTER — Encounter (HOSPITAL_BASED_OUTPATIENT_CLINIC_OR_DEPARTMENT_OTHER): Payer: Self-pay | Admitting: *Deleted

## 2014-04-20 ENCOUNTER — Ambulatory Visit (HOSPITAL_BASED_OUTPATIENT_CLINIC_OR_DEPARTMENT_OTHER)
Admission: RE | Admit: 2014-04-20 | Discharge: 2014-04-20 | Disposition: A | Discharge status: Home / Self Care | Payer: BC Managed Care – PPO | Source: Ambulatory Visit | Attending: Surgery | Admitting: Surgery

## 2014-04-20 DIAGNOSIS — N63 Unspecified lump in breast: Secondary | ICD-10-CM | POA: Diagnosis present

## 2014-04-20 DIAGNOSIS — N6091 Unspecified benign mammary dysplasia of right breast: Secondary | ICD-10-CM | POA: Diagnosis not present

## 2014-04-20 DIAGNOSIS — N6452 Nipple discharge: Secondary | ICD-10-CM | POA: Insufficient documentation

## 2014-04-20 DIAGNOSIS — K219 Gastro-esophageal reflux disease without esophagitis: Secondary | ICD-10-CM | POA: Insufficient documentation

## 2014-04-20 DIAGNOSIS — Z818 Family history of other mental and behavioral disorders: Secondary | ICD-10-CM | POA: Diagnosis not present

## 2014-04-20 DIAGNOSIS — G43909 Migraine, unspecified, not intractable, without status migrainosus: Secondary | ICD-10-CM | POA: Diagnosis not present

## 2014-04-20 HISTORY — PX: BREAST LUMPECTOMY: SHX2

## 2014-04-20 LAB — POCT HEMOGLOBIN-HEMACUE: HEMOGLOBIN: 12.7 g/dL (ref 12.0–15.0)

## 2014-04-20 SURGERY — BREAST LUMPECTOMY
Anesthesia: General | Site: Breast | Laterality: Right

## 2014-04-20 MED ORDER — OXYCODONE HCL 5 MG PO TABS: 5.0000 mg | ORAL_TABLET | Freq: Once | ORAL | Status: DC | PRN

## 2014-04-20 MED ORDER — LACTATED RINGERS IV SOLN
INTRAVENOUS | Status: DC
  Administered 2014-04-20: 07:00:00 via INTRAVENOUS

## 2014-04-20 MED ORDER — MIDAZOLAM HCL 2 MG/2ML IJ SOLN: 1.0000 mg | INTRAMUSCULAR | Status: DC | PRN

## 2014-04-20 MED ORDER — SODIUM BICARBONATE 4 % IV SOLN
INTRAVENOUS | Status: AC
  Filled 2014-04-20: qty 5

## 2014-04-20 MED ORDER — PROPOFOL 10 MG/ML IV EMUL
INTRAVENOUS | Status: AC
  Filled 2014-04-20: qty 100

## 2014-04-20 MED ORDER — ONDANSETRON HCL 4 MG/2ML IJ SOLN
INTRAMUSCULAR | Status: DC | PRN
  Administered 2014-04-20: 4 mg via INTRAVENOUS

## 2014-04-20 MED ORDER — FENTANYL CITRATE 0.05 MG/ML IJ SOLN
INTRAMUSCULAR | Status: AC
  Filled 2014-04-20: qty 6

## 2014-04-20 MED ORDER — CEFAZOLIN SODIUM-DEXTROSE 2-3 GM-% IV SOLR
INTRAVENOUS | Status: DC | PRN
  Administered 2014-04-20: 2 g via INTRAVENOUS

## 2014-04-20 MED ORDER — MIDAZOLAM HCL 2 MG/2ML IJ SOLN
INTRAMUSCULAR | Status: AC
  Filled 2014-04-20: qty 2

## 2014-04-20 MED ORDER — BUPIVACAINE-EPINEPHRINE 0.5% -1:200000 IJ SOLN
INTRAMUSCULAR | Status: DC | PRN
  Administered 2014-04-20: 10 mL

## 2014-04-20 MED ORDER — BUPIVACAINE-EPINEPHRINE (PF) 0.5% -1:200000 IJ SOLN
INTRAMUSCULAR | Status: AC
  Filled 2014-04-20: qty 30

## 2014-04-20 MED ORDER — MIDAZOLAM HCL 5 MG/5ML IJ SOLN
INTRAMUSCULAR | Status: DC | PRN
  Administered 2014-04-20: 2 mg via INTRAVENOUS

## 2014-04-20 MED ORDER — FENTANYL CITRATE 0.05 MG/ML IJ SOLN: 50.0000 ug | INTRAMUSCULAR | Status: DC | PRN

## 2014-04-20 MED ORDER — OXYCODONE HCL 5 MG/5ML PO SOLN: 5.0000 mg | Freq: Once | ORAL | Status: DC | PRN

## 2014-04-20 MED ORDER — LACTATED RINGERS IV SOLN
INTRAVENOUS | Status: DC | PRN
  Administered 2014-04-20: 07:00:00 via INTRAVENOUS

## 2014-04-20 MED ORDER — HYDROMORPHONE HCL 1 MG/ML IJ SOLN: 0.2500 mg | INTRAMUSCULAR | Status: DC | PRN

## 2014-04-20 MED ORDER — KETOROLAC TROMETHAMINE 30 MG/ML IJ SOLN
INTRAMUSCULAR | Status: DC | PRN
  Administered 2014-04-20: 30 mg via INTRAVENOUS

## 2014-04-20 MED ORDER — CEFAZOLIN SODIUM-DEXTROSE 2-3 GM-% IV SOLR: 2.0000 g | INTRAVENOUS | Status: DC

## 2014-04-20 MED ORDER — PROPOFOL 10 MG/ML IV BOLUS
INTRAVENOUS | Status: AC
  Filled 2014-04-20: qty 120

## 2014-04-20 MED ORDER — DEXAMETHASONE SODIUM PHOSPHATE 4 MG/ML IJ SOLN
INTRAMUSCULAR | Status: DC | PRN
  Administered 2014-04-20: 10 mg via INTRAVENOUS

## 2014-04-20 MED ORDER — LIDOCAINE HCL (CARDIAC) 20 MG/ML IV SOLN
INTRAVENOUS | Status: DC | PRN
  Administered 2014-04-20: 50 mg via INTRAVENOUS

## 2014-04-20 MED ORDER — ONDANSETRON HCL 4 MG/2ML IJ SOLN: 4.0000 mg | Freq: Once | INTRAMUSCULAR | Status: DC | PRN

## 2014-04-20 MED ORDER — FENTANYL CITRATE 0.05 MG/ML IJ SOLN
INTRAMUSCULAR | Status: DC | PRN
  Administered 2014-04-20 (×2): 50 ug via INTRAVENOUS

## 2014-04-20 MED ORDER — PROPOFOL 10 MG/ML IV BOLUS
INTRAVENOUS | Status: DC | PRN
  Administered 2014-04-20: 180 mg via INTRAVENOUS

## 2014-04-20 MED ORDER — LIDOCAINE HCL (PF) 1 % IJ SOLN
INTRAMUSCULAR | Status: AC
  Filled 2014-04-20: qty 30

## 2014-04-20 MED ORDER — MEPERIDINE HCL 25 MG/ML IJ SOLN
6.2500 mg | INTRAMUSCULAR | Status: DC | PRN
Start: 2014-04-20 — End: 2014-04-20

## 2014-04-20 SURGICAL SUPPLY — 46 items
APL SKNCLS STERI-STRIP NONHPOA (GAUZE/BANDAGES/DRESSINGS) ×1
BENZOIN TINCTURE PRP APPL 2/3 (GAUZE/BANDAGES/DRESSINGS) ×2 IMPLANT
BINDER BREAST MEDIUM (GAUZE/BANDAGES/DRESSINGS) ×1 IMPLANT
BLADE HEX COATED 2.75 (ELECTRODE) ×2 IMPLANT
BLADE SURG 15 STRL LF DISP TIS (BLADE) ×1 IMPLANT
BLADE SURG 15 STRL SS (BLADE) ×2
CANISTER SUCT 1200ML W/VALVE (MISCELLANEOUS) IMPLANT
CHLORAPREP W/TINT 26ML (MISCELLANEOUS) ×2 IMPLANT
CLIP TI WIDE RED SMALL 6 (CLIP) IMPLANT
COVER BACK TABLE 60X90IN (DRAPES) ×2 IMPLANT
COVER MAYO STAND STRL (DRAPES) ×2 IMPLANT
DECANTER SPIKE VIAL GLASS SM (MISCELLANEOUS) IMPLANT
DEVICE DUBIN W/COMP PLATE 8390 (MISCELLANEOUS) IMPLANT
DRAPE PED LAPAROTOMY (DRAPES) ×2 IMPLANT
DRAPE UTILITY XL STRL (DRAPES) ×2 IMPLANT
DRSG TEGADERM 4X4.75 (GAUZE/BANDAGES/DRESSINGS) ×2 IMPLANT
ELECT REM PT RETURN 9FT ADLT (ELECTROSURGICAL) ×2
ELECTRODE REM PT RTRN 9FT ADLT (ELECTROSURGICAL) ×1 IMPLANT
GLOVE BIOGEL M STRL SZ7.5 (GLOVE) ×1 IMPLANT
GLOVE BIOGEL PI IND STRL 8 (GLOVE) IMPLANT
GLOVE BIOGEL PI INDICATOR 8 (GLOVE) ×1
GLOVE EXAM NITRILE MD LF STRL (GLOVE) ×1 IMPLANT
GLOVE SURG SIGNA 7.5 PF LTX (GLOVE) ×2 IMPLANT
GOWN STRL REUS W/ TWL LRG LVL3 (GOWN DISPOSABLE) ×1 IMPLANT
GOWN STRL REUS W/ TWL XL LVL3 (GOWN DISPOSABLE) ×1 IMPLANT
GOWN STRL REUS W/TWL LRG LVL3 (GOWN DISPOSABLE)
GOWN STRL REUS W/TWL XL LVL3 (GOWN DISPOSABLE) ×4
KIT MARKER MARGIN INK (KITS) ×1 IMPLANT
NDL HYPO 25X1 1.5 SAFETY (NEEDLE) ×1 IMPLANT
NEEDLE HYPO 25X1 1.5 SAFETY (NEEDLE) ×2 IMPLANT
NS IRRIG 1000ML POUR BTL (IV SOLUTION) ×2 IMPLANT
PACK BASIN DAY SURGERY FS (CUSTOM PROCEDURE TRAY) ×2 IMPLANT
PENCIL BUTTON HOLSTER BLD 10FT (ELECTRODE) ×2 IMPLANT
SLEEVE SCD COMPRESS KNEE MED (MISCELLANEOUS) ×1 IMPLANT
SPONGE GAUZE 4X4 12PLY STER LF (GAUZE/BANDAGES/DRESSINGS) ×2 IMPLANT
SPONGE LAP 4X18 X RAY DECT (DISPOSABLE) ×2 IMPLANT
STRIP CLOSURE SKIN 1/2X4 (GAUZE/BANDAGES/DRESSINGS) ×2 IMPLANT
SUT MNCRL AB 4-0 PS2 18 (SUTURE) ×2 IMPLANT
SUT SILK 2 0 SH (SUTURE) ×1 IMPLANT
SUT VIC AB 3-0 SH 27 (SUTURE) ×2
SUT VIC AB 3-0 SH 27X BRD (SUTURE) ×1 IMPLANT
SYR CONTROL 10ML LL (SYRINGE) ×2 IMPLANT
TOWEL OR 17X24 6PK STRL BLUE (TOWEL DISPOSABLE) ×2 IMPLANT
TOWEL OR NON WOVEN STRL DISP B (DISPOSABLE) ×2 IMPLANT
TUBE CONNECTING 20X1/4 (TUBING) ×1 IMPLANT
YANKAUER SUCT BULB TIP NO VENT (SUCTIONS) ×1 IMPLANT

## 2014-04-20 NOTE — Discharge Instructions (Signed)
[facility] °Office Phone Number [phone] ° °BREAST BIOPSY/ LUMPECTOMY: POST OP INSTRUCTIONS ° °Always review your discharge instruction sheet given to you by the facility where your surgery was performed. ° °IF YOU HAVE DISABILITY OR FAMILY LEAVE FORMS, YOU MUST BRING THEM TO THE OFFICE FOR PROCESSING.  DO NOT GIVE THEM TO YOUR DOCTOR. ° °1. A prescription for pain medication may be given to you upon discharge.  Take your pain medication as prescribed, if needed.  If narcotic pain medicine is not needed, then you may take acetaminophen (Tylenol) or ibuprofen (Advil) as needed. °2. Take your usually prescribed medications unless otherwise directed °3. If you need a refill on your pain medication, please contact your pharmacy.  They will contact our office to request authorization.  Prescriptions will not be filled after 5pm or on week-ends. °4. You should eat very light the first 24 hours after surgery, such as soup, crackers, pudding, etc.  Resume your normal diet the day after surgery. °5. Most patients will experience some swelling and bruising in the breast.  Ice packs and a good support bra will help.  Swelling and bruising can take several days to resolve.  °6. It is common to experience some constipation if taking pain medication after surgery.  Increasing fluid intake and taking a stool softener will usually help or prevent this problem from occurring.  A mild laxative (Milk of Magnesia or Miralax) should be taken according to package directions if there are no bowel movements after 48 hours. °7. Unless discharge instructions indicate otherwise, you may remove your bandages 24-48 hours after surgery, and you may shower at that time.  You may have steri-strips (small skin tapes) in place directly over the incision.  These strips should be left on the skin for 7-10 days.  If your surgeon used skin glue on the incision, you may shower in 24 hours.  The glue will flake off over the next 2-3  weeks.  Any sutures or staples will be removed at the office during your follow-up visit. °8. ACTIVITIES:  You may resume regular daily activities (gradually increasing) beginning the next day.  Wearing a good support bra or sports bra minimizes pain and swelling.  You may have sexual intercourse when it is comfortable. °a. You may drive when you no longer are taking prescription pain medication, you can comfortably wear a seatbelt, and you can safely maneuver your car and apply brakes. °b. RETURN TO WORK:  ______________________________________________________________________________________ °9. You should see your doctor in the office for a follow-up appointment approximately two weeks after your surgery.  Your doctor’s nurse will typically make your follow-up appointment when she calls you with your pathology report.  Expect your pathology report 2-3 business days after your surgery.  You may call to check if you do not hear from us after three days. °10. OTHER INSTRUCTIONS: _______________________________________________________________________________________________ _____________________________________________________________________________________________________________________________________ °_____________________________________________________________________________________________________________________________________ °_____________________________________________________________________________________________________________________________________ ° °WHEN TO CALL YOUR DOCTOR: °1. Fever over 101.0 °2. Nausea and/or vomiting. °3. Extreme swelling or bruising. °4. Continued bleeding from incision. °5. Increased pain, redness, or drainage from the incision. ° °The clinic staff is available to answer your questions during regular business hours.  Please don’t hesitate to call and ask to speak to one of the nurses for clinical concerns.  If you have a medical emergency, go to the nearest emergency  room or call 911.  A surgeon from [facility] is always on call at the hospital. ° °For further questions, please visit [email or web address]  ° °  Post Anesthesia Home Care Instructions ° °Activity: °Get plenty of rest for the remainder of the day. A responsible adult should stay with you for 24 hours following the procedure.  °For the next 24 hours, DO NOT: °-Drive a car °-Operate machinery °-Drink alcoholic beverages °-Take any medication unless instructed by your physician °-Make any legal decisions or sign important papers. ° °Meals: °Start with liquid foods such as gelatin or soup. Progress to regular foods as tolerated. Avoid greasy, spicy, heavy foods. If nausea and/or vomiting occur, drink only clear liquids until the nausea and/or vomiting subsides. Call your physician if vomiting continues. ° °Special Instructions/Symptoms: °Your throat may feel dry or sore from the anesthesia or the breathing tube placed in your throat during surgery. If this causes discomfort, gargle with warm salt water. The discomfort should disappear within 24 hours. ° °

## 2014-04-20 NOTE — Anesthesia Preprocedure Evaluation (Signed)
Anesthesia Evaluation  Patient identified by MRN, date of birth, ID band Patient awake    Reviewed: Allergy & Precautions, H&P , NPO status   Airway Mallampati: I  TM Distance: >3 FB Neck ROM: Full    Dental   Pulmonary          Cardiovascular     Neuro/Psych    GI/Hepatic GERD-  Medicated and Controlled,  Endo/Other    Renal/GU      Musculoskeletal   Abdominal   Peds  Hematology   Anesthesia Other Findings   Reproductive/Obstetrics                             Anesthesia Physical Anesthesia Plan  ASA: II  Anesthesia Plan: General   Post-op Pain Management:    Induction: Intravenous  Airway Management Planned: LMA  Additional Equipment:   Intra-op Plan:   Post-operative Plan: Extubation in OR  Informed Consent: I have reviewed the patients History and Physical, chart, labs and discussed the procedure including the risks, benefits and alternatives for the proposed anesthesia with the patient or authorized representative who has indicated his/her understanding and acceptance.     Plan Discussed with: CRNA and Surgeon  Anesthesia Plan Comments:         Anesthesia Quick Evaluation

## 2014-04-20 NOTE — Interval H&P Note (Signed)
History and Physical Interval Note: no change in H and P  04/20/2014 7:13 AM  Deanna Carter  has presented today for surgery, with the diagnosis of Right Breast Papilloma  The various methods of treatment have been discussed with the patient and family. After consideration of risks, benefits and other options for treatment, the patient has consented to  Procedure(s): RIGHT BREAST LUMPECTOMY (Right) as a surgical intervention .  The patient's history has been reviewed, patient examined, no change in status, stable for surgery.  I have reviewed the patient's chart and labs.  Questions were answered to the patient's satisfaction.     Tatsuya Okray A

## 2014-04-20 NOTE — Op Note (Signed)
RIGHT BREAST LUMPECTOMY  Procedure Note  ELLEEN COULIBALY 04/20/2014   Pre-op Diagnosis: Right Breast Papilloma     Post-op Diagnosis: same  Procedure(s): RIGHT BREAST LUMPECTOMY  Surgeon(s): Coralie Keens, MD  Anesthesia: Choice  Staff:  Circulator: Eston Esters, RN Scrub Person: Humberto Seals, RN  Estimated Blood Loss: Minimal               Specimens: sent to path          [facility]   Date: 04/20/2014  Time: 8:03 AM

## 2014-04-20 NOTE — Anesthesia Postprocedure Evaluation (Signed)
Anesthesia Post Note  Patient: Deanna Carter  Procedure(s) Performed: Procedure(s) (LRB): RIGHT BREAST LUMPECTOMY (Right)  Anesthesia type: general  Patient location: PACU  Post pain: Pain level controlled  Post assessment: Patient's Cardiovascular Status Stable  Last Vitals:  Filed Vitals:   04/20/14 0900  BP: 140/93  Pulse: 63  Temp: 36.8 C  Resp: 20    Post vital signs: Reviewed and stable  Level of consciousness: sedated  Complications: No apparent anesthesia complications

## 2014-04-20 NOTE — Anesthesia Procedure Notes (Signed)
Procedure Name: LMA Insertion Date/Time: 04/20/2014 7:36 AM Performed by: Toula Moos L Pre-anesthesia Checklist: Patient identified, Emergency Drugs available, Suction available, Patient being monitored and Timeout performed Patient Re-evaluated:Patient Re-evaluated prior to inductionOxygen Delivery Method: Circle System Utilized Preoxygenation: Pre-oxygenation with 100% oxygen Intubation Type: IV induction Ventilation: Mask ventilation without difficulty LMA: LMA inserted LMA Size: 4.0 Number of attempts: 1 Airway Equipment and Method: bite block Placement Confirmation: positive ETCO2 Tube secured with: Tape Dental Injury: Teeth and Oropharynx as per pre-operative assessment

## 2014-04-20 NOTE — Transfer of Care (Signed)
Immediate Anesthesia Transfer of Care Note  Patient: Deanna Carter  Procedure(s) Performed: Procedure(s): RIGHT BREAST LUMPECTOMY (Right)  Patient Location: PACU  Anesthesia Type:General  Level of Consciousness: sedated and patient cooperative  Airway & Oxygen Therapy: Patient Spontanous Breathing and Patient connected to face mask oxygen  Post-op Assessment: Report given to PACU RN and Post -op Vital signs reviewed and stable  Post vital signs: Reviewed and stable  Complications: No apparent anesthesia complications

## 2014-04-23 ENCOUNTER — Encounter (HOSPITAL_BASED_OUTPATIENT_CLINIC_OR_DEPARTMENT_OTHER): Payer: Self-pay | Admitting: Surgery

## 2014-04-24 NOTE — Op Note (Signed)
[name], Deanna Carter            ACCOUNT NO.:  [id number]  MEDICAL RECORD NO.:  [id number]  LOCATION:                                 FACILITY:  PHYSICIAN:  Coralie Keens, M.D. DATE OF BIRTH:  Jan 11, 1966  DATE OF PROCEDURE:  04/20/2014 DATE OF DISCHARGE:  04/20/2014                              OPERATIVE REPORT   PREOPERATIVE DIAGNOSIS:  Right breast mass.  POSTOPERATIVE DIAGNOSIS:  Right breast mass.  PROCEDURE:  Right breast lumpectomy.  SURGEON:  Coralie Keens, M.D.  ANESTHESIA:  General and 0.5% Marcaine with epinephrine.  ESTIMATED BLOOD LOSS:  Minimal.  INDICATIONS:  This is a 48 year old female who has had several papillomas excised from her right breast.  She now again presents with nipple discharge and x-ray findings consistent with a papilloma at the retroareolar 4 o'clock position.  Decision was made to proceed with excision of this area of the breast.  PROCEDURE IN DETAIL:  The patient was brought to the operating room, identified as Deanna Carter.  She was placed supine on operating room table and anesthesia was induced.  Her right breast was prepped and draped in usual sterile fashion.  I anesthetized the skin on the medial edge of the areola with Marcaine.  I then made a circumareolar incision with a scalpel.  I took this down to the breast tissue with electrocautery.  I then did a central duct excision excising all the breast tissue underneath the nipple and all the tissue from the 3 o'clock to 6 o'clock position going approximately 45 cm deep into the breast tissue.  No dilated ducts were identified.  At this point, once the specimen was completely removed and sent to Pathology for evaluation.  I achieved hemostasis with cautery.  I then anesthetized the area further with Marcaine.  I closed subcutaneous tissue with interrupted 3-0 Vicryl sutures and closed the skin with running 4-0 Monocryl.  I placed a suture on to the central duct system  just underneath the nipple.  Steri-Strips, gauze, and Tegaderm were then applied.  The patient tolerated the procedure well.  All the counts were correct at the end of procedure.  The patient was then extubated in the operating room and taken in a stable to recovery room.     Coralie Keens, M.D.    DB/MEDQ  D:  04/20/2014  T:  04/20/2014  Job:  [id number]

## 2014-08-07 ENCOUNTER — Encounter: Payer: Self-pay | Admitting: Nurse Practitioner

## 2014-08-07 ENCOUNTER — Ambulatory Visit (INDEPENDENT_AMBULATORY_CARE_PROVIDER_SITE_OTHER): Payer: BLUE CROSS/BLUE SHIELD | Admitting: Nurse Practitioner

## 2014-08-07 VITALS — BP 134/77 | HR 59 | Ht 67.0 in | Wt 169.4 lb

## 2014-08-07 DIAGNOSIS — Z5181 Encounter for therapeutic drug level monitoring: Secondary | ICD-10-CM

## 2014-08-07 DIAGNOSIS — G43009 Migraine without aura, not intractable, without status migrainosus: Secondary | ICD-10-CM

## 2014-08-07 MED ORDER — SUMATRIPTAN SUCCINATE 100 MG PO TABS: 100.0000 mg | ORAL_TABLET | ORAL | Status: DC

## 2014-08-07 NOTE — Patient Instructions (Signed)
Neck exercises to perform Labs today  Limit use of Tylenol Continue Imitrex acutely May try preventive at later date F/U in 3 months

## 2014-08-07 NOTE — Progress Notes (Signed)
[facility]  PATIENT: Deanna Carter DOB: 05-12-1966   REASON FOR VISIT: follow up for migraine HISTORY FROM:patient    HISTORY OF PRESENT ILLNESS:Deanna Carter is a 49 year old female with a history of migraines. She returns today for follow-up. She currently uses imitrex and reports that they work well for her. Each month varies depending on the weather in terms of her migraines. She had to take 2 Imitrex last week before the migraine went away. Typically her migraines her triggered by the weather.  Her migraines are usually located be hind the left ear. She does have nausea but no vomiting with her migraines. + photophobia but denies phonophobia.She has never been on a preventative in the past and is reluctant to do so. She is taking a lot of Tylenol and was cautioned against that in terms of rebound.  No new neurological complaints. She also reports today that she occasionally has some neck discomfort due to her chair at work and the strain on her neck. She has never done any neck exercises. She needs refills on her Imitrex. She returns for reevaluation.   HISTORY 09/14/13 (CM): 49 year old female returns for followup. She has a history of migraine headaches for about 13 years respond well to Imitrex. She has never been able to figure out migraine triggers except weather changes and sometimes stress. She is not aware of any foods that cause problems .She denies visual changes lateralized motor or sensory deficits in between episodes, headaches always starts left retro-orbital area associated with noise and light sensitivity can last several days but this does not happen frequently. She is having one to 2 a month. She returns for reevaluation HISTORY: She has a history of migraine headache for more than 12 years, only happened occasionally, responded very well to Imitrex injection, she has been doing very well for many years, began to develop frequent prolonged, severe  migraine headaches again over the past year. Couple times a week, she has mild lateralized headaches, over past 6 months, she had 2 severe migraine headaches, left retro-orbital area severe pounding headache, with associated light noise sensitivity, lasting for 2-3 days, over-the-counter medicine would not help. She denies visual change, lateralized motor or sensory deficit in between episodes, Trigger for migraines a bariatric pressure changes, stress CT sinus was normal   REVIEW OF SYSTEMS: Full 14 system review of systems performed and notable only for those listed, all others are neg:  Constitutional: neg  Cardiovascular: neg Ear/Nose/Throat: neg  Skin: neg Eyes: neg Respiratory: neg Gastroitestinal: neg  Hematology/Lymphatic: neg  Endocrine: neg Musculoskeletal:neg Allergy/Immunology: neg Neurological: Headache Psychiatric: neg Sleep : neg   ALLERGIES: No Known Allergies  HOME MEDICATIONS: Outpatient Prescriptions Prior to Visit  Medication Sig Dispense Refill  . acetaminophen (TYLENOL) 500 MG tablet Take 1,000 mg by mouth daily as needed for headache. For pain    . ACZONE 5 % topical gel Apply 1 application topically 2 (two) times daily. For face    . Ascorbic Acid (VITAMIN C PO) Take 1 tablet by mouth 2 (two) times daily.     Marland Kitchen azelastine (ASTELIN) 0.1 % nasal spray Place 2 sprays into the nose.    . calcium carbonate (OS-CAL) 600 MG TABS tablet Take 600 mg by mouth 2 (two) times daily with a meal.    . cycloSPORINE (RESTASIS) 0.05 % ophthalmic emulsion Place 1 drop into both eyes daily.     . diazepam (VALIUM) 5 MG tablet Take 5 mg by mouth  as needed.    Marland Kitchen ibuprofen (ADVIL,MOTRIN) 200 MG tablet Take 600 mg by mouth daily as needed (for headaches). For pain    . loratadine (CLARITIN) 10 MG tablet Take 10 mg by mouth daily.    . mometasone (NASONEX) 50 MCG/ACT nasal spray Place 2 sprays into the nose.    . Norgestimate-Ethinyl Estradiol Triphasic 0.18/0.215/0.25 MG-35 MCG  tablet     . olopatadine (PATANOL) 0.1 % ophthalmic solution Place 1 drop into both eyes 2 (two) times daily.     Marland Kitchen omeprazole (PRILOSEC) 20 MG capsule Take 20 mg by mouth every morning.     Marland Kitchen OVER THE COUNTER MEDICATION Take 1 packet by mouth daily as needed (for headaches). Goodies Powder as needed for pain    . OVER THE COUNTER MEDICATION Take 1 capsule by mouth 2 (two) times daily. Bio Tears    . Probiotic Product (PROBIOTIC DAILY PO) Take 1 tablet by mouth daily.    . SUMAtriptan (IMITREX) 100 MG tablet Take 100 mg by mouth 2 (two) times a week. No more than 2 in 24 hour period    . Tretinoin (RETIN-A EX) Apply 1 application topically every other day. For face    . VITAMIN D, CHOLECALCIFEROL, PO Take 1 tablet by mouth daily.     Marland Kitchen zolpidem (AMBIEN) 10 MG tablet Take 10 mg by mouth at bedtime. For sleep     No facility-administered medications prior to visit.    PAST MEDICAL HISTORY: Past Medical History  Diagnosis Date  . GERD (gastroesophageal reflux disease)   . Generalized headaches     tension - only takes medication as needed to treat  . H/O hiatal hernia     PAST SURGICAL HISTORY: Past Surgical History  Procedure Laterality Date  . Bladder suspension  2005, 2009  . Tubal ligation  2005  . Breast surgery  03/26/2011    Right Breast Mass  . Mastectomy, partial  03/26/2011  . Breast lumpectomy  2012  . Eye surgery Bilateral 1999    lasik  . Breast lumpectomy Right 11/02/2013    Procedure: RIGHT BREAST LUMPECTOMY;  Surgeon: Harl Bowie, MD;  Location: [facility];  Service: General;  Laterality: Right;  . Breast lumpectomy Right 04/20/2014    Procedure: RIGHT BREAST LUMPECTOMY;  Surgeon: Coralie Keens, MD;  Location: [facility] [facility];  Service: General;  Laterality: Right;    FAMILY HISTORY: No family history on file.  SOCIAL HISTORY: History   Social History  . Marital Status: Married    Spouse Name: Abe People   . Number of Children: 2  . Years of  Education: college   Occupational History  .  [employer]   Social History Main Topics  . Smoking status: Never Smoker   . Smokeless tobacco: Never Used  . Alcohol Use: Yes     Comment: rarely  . Drug Use: No  . Sexual Activity: Yes    Birth Control/ Protection: Pill   Other Topics Concern  . Not on file   Social History Narrative   Patient lives at home with husband Abe People.    Patient has 2 children.    Patient is currently working.    Patient has her Bachelors    Patient is right handed.     PHYSICAL EXAM  Filed Vitals:   08/07/14 0810  Height: 5\' 7"  (1.702 m)   There is no weight on file to calculate BMI.  Generalized: Well developed, in no acute distress  Head: normocephalic and atraumatic,. Oropharynx benign  Neck: Supple, no carotid bruits  Musculoskeletal: No deformity  Neurological examination  Mentation: Alert oriented to time, place, history taking. Follows all commands speech and language fluent Cranial nerve II-XII: Pupils were equal round reactive to light. Extraocular movements were full, visual field were full on confrontational test. Facial sensation and strength were normal. Uvula tongue midline. Head turning and shoulder shrug were normal and symmetric. Motor: The motor testing reveals 5 over 5 strength of all 4 extremities. Good symmetric motor tone is noted throughout.  Sensory: Sensory testing is intact to soft touch on all 4 extremities. No evidence of extinction is noted.  Coordination: Cerebellar testing reveals good finger-nose-finger and heel-to-shin bilaterally.  Gait and station: Gait is normal. Tandem gait is normal. Romberg is negative. No drift is seen.  Reflexes: Deep tendon reflexes are symmetric and normal bilaterally.   DIAGNOSTIC DATA (LABS, IMAGING, TESTING) -  ASSESSMENT AND PLAN  49 y.o. year old female  has a past medical history of  Generalized headaches; migraine, and new complaint of neck pain brought on  by poor posture at work she thinks. She is taking a lot of Tylenol for headaches and was cautioned against this because of rebound. She has not had recent labs done  Neck exercises to perform given demonstration and exercises to review Labs today , CMP to monitor liver function Limit use of Tylenol Continue Imitrex acutely will refill May try preventive at later date Needs to obtain ergonomic chair for her desk.  F/U in 3 months Dennie Bible, [facility], [facility], APRN  [facility] [address], [place], [place] [address] [phone]

## 2014-08-08 ENCOUNTER — Telehealth: Payer: Self-pay

## 2014-08-08 LAB — COMPREHENSIVE METABOLIC PANEL
ALBUMIN: 4.1 g/dL (ref 3.5–5.5)
ALT: 16 IU/L (ref 0–32)
AST: 20 IU/L (ref 0–40)
Albumin/Globulin Ratio: 1.6 (ref 1.1–2.5)
Alkaline Phosphatase: 59 IU/L (ref 39–117)
BUN/Creatinine Ratio: 14 (ref 9–23)
BUN: 11 mg/dL (ref 6–24)
Bilirubin Total: 0.3 mg/dL (ref 0.0–1.2)
CALCIUM: 9.1 mg/dL (ref 8.7–10.2)
CO2: 28 mmol/L (ref 18–29)
Chloride: 100 mmol/L (ref 97–108)
Creatinine, Ser: 0.78 mg/dL (ref 0.57–1.00)
GFR calc non Af Amer: 90 mL/min/{1.73_m2} (ref >59)
GFR, EST AFRICAN AMERICAN: 104 mL/min/{1.73_m2} (ref >59)
GLUCOSE: 60 mg/dL — AB (ref 65–99)
Globulin, Total: 2.5 g/dL (ref 1.5–4.5)
POTASSIUM: 3.8 mmol/L (ref 3.5–5.2)
Sodium: 141 mmol/L (ref 134–144)
Total Protein: 6.6 g/dL (ref 6.0–8.5)

## 2014-08-08 NOTE — Telephone Encounter (Signed)
-----   Message from Otilio Jefferson, NP sent at 08/08/2014 10:04 AM EDT ----- Labs look good please call the patient

## 2014-08-08 NOTE — Telephone Encounter (Signed)
Spoke to patient. Gave lab results. Patient verbalized understanding.  

## 2014-08-10 NOTE — Progress Notes (Signed)
I have reviewed and agreed above plan. 

## 2014-08-27 ENCOUNTER — Telehealth: Payer: Self-pay

## 2014-08-27 NOTE — Telephone Encounter (Signed)
Spoke to patient to resched 09/19/14 appt. Patient had earlier appt and f/u scheduled for June. Canceled 09/19/14 appt.

## 2014-09-18 ENCOUNTER — Ambulatory Visit: Payer: BC Managed Care – PPO | Admitting: Nurse Practitioner

## 2014-09-19 ENCOUNTER — Ambulatory Visit: Payer: BC Managed Care – PPO | Admitting: Nurse Practitioner

## 2014-11-05 ENCOUNTER — Telehealth: Payer: Self-pay | Admitting: *Deleted

## 2014-11-05 NOTE — Telephone Encounter (Signed)
   Deanna Carter called and saw Deanna Carter back in March and she gave her some exercises for her to do with her migraines and wanted Deanna Carter to know they are working really well the exercises she gave her to do.  FYI

## 2014-11-07 ENCOUNTER — Ambulatory Visit: Payer: BLUE CROSS/BLUE SHIELD | Admitting: Nurse Practitioner

## 2015-02-05 ENCOUNTER — Ambulatory Visit (INDEPENDENT_AMBULATORY_CARE_PROVIDER_SITE_OTHER): Payer: BLUE CROSS/BLUE SHIELD | Admitting: Nurse Practitioner

## 2015-02-05 ENCOUNTER — Encounter: Payer: Self-pay | Admitting: Nurse Practitioner

## 2015-02-05 VITALS — BP 122/88 | HR 56 | Ht 68.0 in | Wt 167.8 lb

## 2015-02-05 DIAGNOSIS — G43009 Migraine without aura, not intractable, without status migrainosus: Secondary | ICD-10-CM | POA: Diagnosis not present

## 2015-02-05 MED ORDER — SUMATRIPTAN SUCCINATE 100 MG PO TABS: 100.0000 mg | ORAL_TABLET | ORAL | Status: DC

## 2015-02-05 NOTE — Patient Instructions (Signed)
Continue Imitrex at current dose will renew F/U yearly and prn

## 2015-02-05 NOTE — Progress Notes (Signed)
I have reviewed and agreed above plan. 

## 2015-02-05 NOTE — Progress Notes (Signed)
[facility]  PATIENT: Deanna Carter DOB: August 02, 1965   REASON FOR VISIT: Follow-up for migraine, neck pain HISTORY FROM: Patient    HISTORY OF PRESENT ILLNESS::[name] is a 49 year old female with a history of migraines. She returns today for follow-up. She currently uses Imitrex and reports that they work well for her. Each month varies depending on the weather in terms of her migraines. Generally speaking she has no more than 4 headaches per month. Typically her migraines her triggered by the weather. Her migraines are usually located be hind the left ear. She does have nausea but no vomiting with her migraines, + photophobia but denies phonophobia.She has never been on a preventative in the past and is reluctant to do so. She is limiting her use of Tylenol.  No new neurological complaints. She was given neck exercises at her last visit when she reported neck discomfort due to her chair at work and the strain on her neck. She claims this is been very beneficial. She needs refills on her Imitrex. She returns for reevaluation.   HISTORY 09/14/13 (CM): 49 year old female returns for followup. She has a history of migraine headaches for about 13 years respond well to Imitrex. She has never been able to figure out migraine triggers except weather changes and sometimes stress. She is not aware of any foods that cause problems .She denies visual changes lateralized motor or sensory deficits in between episodes, headaches always starts left retro-orbital area associated with noise and light sensitivity can last several days but this does not happen frequently. She is having one to 2 a month. She returns for reevaluation HISTORY: She has a history of migraine headache for more than 12 years, only happened occasionally, responded very well to Imitrex injection, she has been doing very well for many years, began to develop frequent prolonged, severe migraine headaches again over  the past year. Couple times a week, she has mild lateralized headaches, over past 6 months, she had 2 severe migraine headaches, left retro-orbital area severe pounding headache, with associated light noise sensitivity, lasting for 2-3 days, over-the-counter medicine would not help. She denies visual change, lateralized motor or sensory deficit in between episodes, Trigger for migraines a bariatric pressure changes, stress CT sinus was normal   REVIEW OF SYSTEMS: Full 14 system review of systems performed and notable only for those listed, all others are neg:  Constitutional: neg  Cardiovascular: neg Ear/Nose/Throat: neg  Skin: neg Eyes: neg Respiratory: neg Gastroitestinal: neg  Hematology/Lymphatic: neg  Endocrine: neg Musculoskeletal: Neck pain Allergy/Immunology: Environmental allergies Neurological: History of migraines Psychiatric: neg Sleep : neg   ALLERGIES: No Known Allergies  HOME MEDICATIONS: Outpatient Prescriptions Prior to Visit  Medication Sig Dispense Refill  . acetaminophen (TYLENOL) 500 MG tablet Take 1,000 mg by mouth daily as needed for headache. For pain    . ACZONE 5 % topical gel Apply 1 application topically 2 (two) times daily. For face    . Ascorbic Acid (VITAMIN C PO) Take 1 tablet by mouth 2 (two) times daily.     Marland Kitchen azelastine (ASTELIN) 0.1 % nasal spray Place 2 sprays into the nose.    . calcium carbonate (OS-CAL) 600 MG TABS tablet Take 600 mg by mouth 2 (two) times daily with a meal.    . cycloSPORINE (RESTASIS) 0.05 % ophthalmic emulsion Place 1 drop into both eyes daily.     . diazepam (VALIUM) 5 MG tablet Take 5 mg by mouth as needed.    Marland Kitchen  ibuprofen (ADVIL,MOTRIN) 200 MG tablet Take 600 mg by mouth daily as needed (for headaches). For pain    . loratadine (CLARITIN) 10 MG tablet Take 10 mg by mouth daily.    . Norgestimate-Ethinyl Estradiol Triphasic 0.18/0.215/0.25 MG-35 MCG tablet     . olopatadine (PATANOL) 0.1 % ophthalmic solution Place 1  drop into both eyes 2 (two) times daily.     Marland Kitchen omeprazole (PRILOSEC) 20 MG capsule Take 20 mg by mouth every morning.     Marland Kitchen OVER THE COUNTER MEDICATION Take 1 packet by mouth daily as needed (for headaches). Goodies Powder as needed for pain    . OVER THE COUNTER MEDICATION Take 1 capsule by mouth 2 (two) times daily. Bio Tears    . Probiotic Product (PROBIOTIC DAILY PO) Take 1 tablet by mouth daily.    . SUMAtriptan (IMITREX) 100 MG tablet Take 1 tablet (100 mg total) by mouth 2 (two) times a week. No more than 2 in 24 hour period 10 tablet 6  . VITAMIN D, CHOLECALCIFEROL, PO Take 1 tablet by mouth daily.     Marland Kitchen zolpidem (AMBIEN) 10 MG tablet Take 10 mg by mouth at bedtime. For sleep    . mometasone (NASONEX) 50 MCG/ACT nasal spray Place 2 sprays into the nose.    . Tretinoin (RETIN-A EX) Apply 1 application topically every other day. For face     No facility-administered medications prior to visit.    PAST MEDICAL HISTORY: Past Medical History  Diagnosis Date  . GERD (gastroesophageal reflux disease)   . Generalized headaches     tension - only takes medication as needed to treat  . H/O hiatal hernia     PAST SURGICAL HISTORY: Past Surgical History  Procedure Laterality Date  . Bladder suspension  2005, 2009  . Tubal ligation  2005  . Breast surgery  03/26/2011    Right Breast Mass  . Mastectomy, partial  03/26/2011  . Breast lumpectomy  2012  . Eye surgery Bilateral 1999    lasik  . Breast lumpectomy Right 11/02/2013    Procedure: RIGHT BREAST LUMPECTOMY;  Surgeon: Harl Bowie, MD;  Location: [facility];  Service: General;  Laterality: Right;  . Breast lumpectomy Right 04/20/2014    Procedure: RIGHT BREAST LUMPECTOMY;  Surgeon: Coralie Keens, MD;  Location: [facility];  Service: General;  Laterality: Right;    FAMILY HISTORY: History reviewed. No pertinent family history.  SOCIAL HISTORY: Social History   Social History  . Marital Status: Married     Spouse Name: Abe People   . Number of Children: 2  . Years of Education: college   Occupational History  .  [employer]   Social History Main Topics  . Smoking status: Never Smoker   . Smokeless tobacco: Never Used  . Alcohol Use: Yes     Comment: rarely  . Drug Use: No  . Sexual Activity: Yes    Birth Control/ Protection: Pill   Other Topics Concern  . Not on file   Social History Narrative   Patient lives at home with husband Abe People.    Patient has 2 children.    Patient is currently working.    Patient has her Bachelors    Patient is right handed.     PHYSICAL EXAM  Filed Vitals:   02/05/15 0901  BP: 122/88  Pulse: 56  Height: 5\' 8"  (1.727 m)  Weight: 167 lb 12.8 oz (76.114 kg)   Body mass index  is 25.52 kg/(m^2). Generalized: Well developed, in no acute distress  Head: normocephalic and atraumatic,. Oropharynx benign  Neck: Supple, no carotid bruits  Musculoskeletal: No deformity  Neurological examination  Mentation: Alert oriented to time, place, history taking. Follows all commands speech and language fluent Cranial nerve II-XII: Pupils were equal round reactive to light. Extraocular movements were full, visual field were full on confrontational test. Facial sensation and strength were normal. Uvula tongue midline. Head turning and shoulder shrug were normal and symmetric. Motor: The motor testing reveals 5 over 5 strength of all 4 extremities. Good symmetric motor tone is noted throughout.  Sensory: Sensory testing is intact to soft touch on all 4 extremities. No evidence of extinction is noted.  Coordination: Cerebellar testing reveals good finger-nose-finger and heel-to-shin bilaterally.  Gait and station: Gait is normal. Tandem gait is normal. Romberg is negative. No drift is seen.  Reflexes: Deep tendon reflexes are symmetric and normal bilaterally.  DIAGNOSTIC DATA (LABS, IMAGING, TESTING) - I reviewed patient records, labs, notes,  testing and imaging myself where available.      Component Value Date/Time   NA 141 08/07/2014 0847   K 3.8 08/07/2014 0847   CL 100 08/07/2014 0847   CO2 28 08/07/2014 0847   GLUCOSE 60* 08/07/2014 0847   BUN 11 08/07/2014 0847   CREATININE 0.78 08/07/2014 0847   CALCIUM 9.1 08/07/2014 0847   PROT 6.6 08/07/2014 0847   AST 20 08/07/2014 0847   ALT 16 08/07/2014 0847   ALKPHOS 59 08/07/2014 0847   BILITOT 0.3 08/07/2014 0847   GFRNONAA 90 08/07/2014 0847   GFRAA 104 08/07/2014 0847    ASSESSMENT AND PLAN  49 y.o. year old female  has a past medical history of  Generalized headaches/migraine here to follow-up.  Continue Imitrex at current dose will renew Continue neck exercises 2-3 times a day Call for increase in headaches F/U yearly and prn Dennie Bible, [facility], [facility], APRN  [facility] [address] [place],  [address] [phone]

## 2015-02-07 ENCOUNTER — Telehealth: Payer: Self-pay | Admitting: Nurse Practitioner

## 2015-02-07 MED ORDER — GABAPENTIN 100 MG PO CAPS: 100.0000 mg | ORAL_CAPSULE | Freq: Every day | ORAL | Status: DC

## 2015-02-07 NOTE — Telephone Encounter (Signed)
The patient is calling. She states she would like a Rx called in for a preventative for occipital neuralgia as discussed at her last office visit. [facility] [place] on [address]. Thank you.

## 2015-03-07 ENCOUNTER — Other Ambulatory Visit: Payer: Self-pay | Admitting: Obstetrics and Gynecology

## 2015-03-07 DIAGNOSIS — D0511 Intraductal carcinoma in situ of right breast: Secondary | ICD-10-CM

## 2015-03-13 ENCOUNTER — Other Ambulatory Visit: Payer: Self-pay

## 2015-03-14 ENCOUNTER — Ambulatory Visit
Admission: RE | Admit: 2015-03-14 | Discharge: 2015-03-14 | Disposition: A | Discharge status: Home / Self Care | Payer: BLUE CROSS/BLUE SHIELD | Source: Ambulatory Visit | Attending: Obstetrics and Gynecology | Admitting: Obstetrics and Gynecology

## 2015-03-14 ENCOUNTER — Other Ambulatory Visit: Payer: Self-pay | Admitting: Obstetrics and Gynecology

## 2015-03-14 DIAGNOSIS — D0511 Intraductal carcinoma in situ of right breast: Secondary | ICD-10-CM

## 2015-04-29 ENCOUNTER — Telehealth: Payer: Self-pay | Admitting: Nurse Practitioner

## 2015-04-29 NOTE — Telephone Encounter (Signed)
I called and spoke to [place] at [facility].  Pt had used old prescription.. They will use new prescription and the new refill off that one.

## 2015-04-29 NOTE — Telephone Encounter (Addendum)
Patient is calling to get refill for Rx Sumatriptan 100 mg tablets to [facility] [place], [place].  Thanks!

## 2015-05-07 ENCOUNTER — Other Ambulatory Visit: Payer: Self-pay

## 2015-05-07 MED ORDER — SUMATRIPTAN SUCCINATE 100 MG PO TABS: 100.0000 mg | ORAL_TABLET | ORAL | Status: DC

## 2015-11-06 ENCOUNTER — Telehealth: Payer: Self-pay | Admitting: Nurse Practitioner

## 2015-11-06 MED ORDER — SUMATRIPTAN SUCCINATE 100 MG PO TABS: 100.0000 mg | ORAL_TABLET | ORAL | Status: AC

## 2015-11-06 NOTE — Telephone Encounter (Signed)
Sent prescription to [facility].

## 2015-11-06 NOTE — Telephone Encounter (Signed)
Patient is calling and states she has a new insurance with Christella Scheuermann and a new home delivery pharmacy.  She would like her Rx sumatriptan 100 mg tablets sent to [facility] @[phone]. [id number]. Group [id number]. Employed by [employer].

## 2015-11-06 NOTE — Telephone Encounter (Signed)
LMVM for pt that did receive message and sent prescription.

## 2016-02-05 ENCOUNTER — Ambulatory Visit: Payer: BLUE CROSS/BLUE SHIELD | Admitting: Nurse Practitioner

## 2016-02-21 ENCOUNTER — Other Ambulatory Visit: Payer: Self-pay | Admitting: Obstetrics and Gynecology

## 2016-02-21 DIAGNOSIS — Z1231 Encounter for screening mammogram for malignant neoplasm of breast: Secondary | ICD-10-CM

## 2016-03-17 ENCOUNTER — Ambulatory Visit: Payer: BLUE CROSS/BLUE SHIELD

## 2016-05-21 ENCOUNTER — Ambulatory Visit: Payer: BLUE CROSS/BLUE SHIELD

## 2016-07-16 ENCOUNTER — Other Ambulatory Visit: Payer: Self-pay | Admitting: Obstetrics and Gynecology

## 2016-07-16 DIAGNOSIS — R928 Other abnormal and inconclusive findings on diagnostic imaging of breast: Secondary | ICD-10-CM

## 2016-07-20 ENCOUNTER — Ambulatory Visit
Admission: RE | Admit: 2016-07-20 | Discharge: 2016-07-20 | Disposition: A | Discharge status: Home / Self Care | Payer: Commercial Indemnity | Source: Ambulatory Visit | Attending: Obstetrics and Gynecology | Admitting: Obstetrics and Gynecology

## 2016-07-20 DIAGNOSIS — R928 Other abnormal and inconclusive findings on diagnostic imaging of breast: Secondary | ICD-10-CM

## 2019-02-26 IMAGING — MG 2D DIGITAL DIAGNOSTIC UNILATERAL RIGHT MAMMOGRAM WITH CAD AND AD
6 series · 6 of 14 positions shown · non-contrast
Comparison: Previous exam(s).

CLINICAL DATA: Patient recalled from screening for possible right
breast thickening. Patient has had multiple subareolar excisional
biopsies right breast.

EXAM:
DIGITAL DIAGNOSTIC RIGHT MAMMOGRAM WITH CAD
ULTRASOUND RIGHT BREAST

[R MLO]
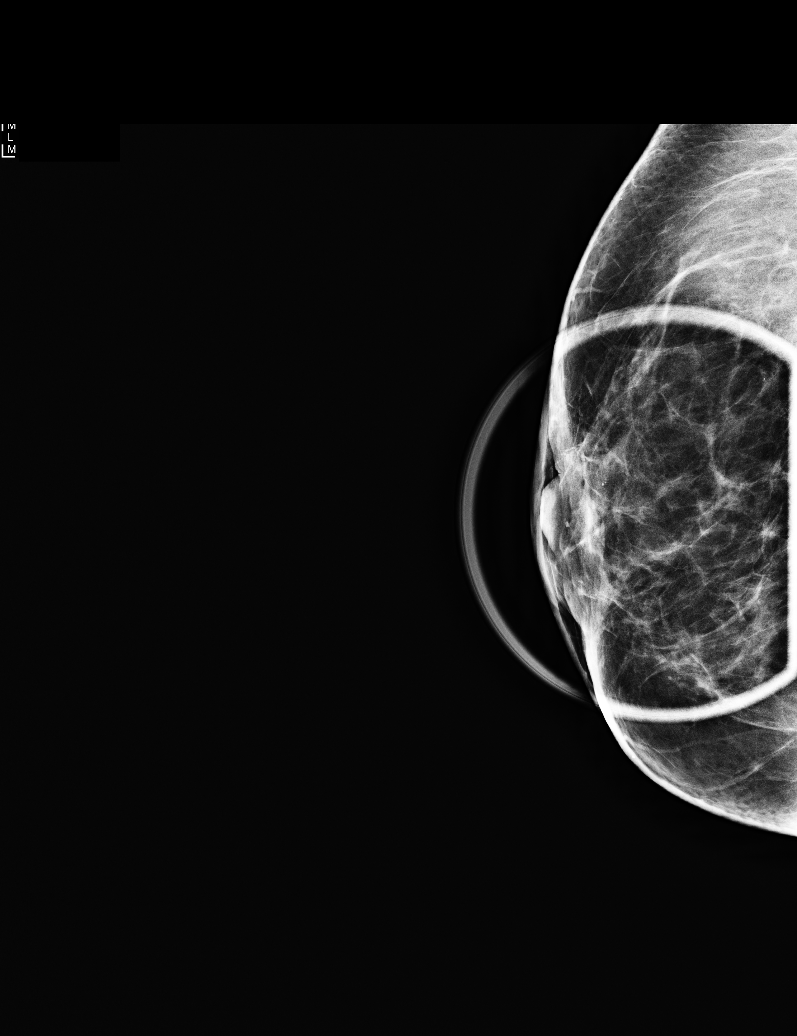

[R CC]
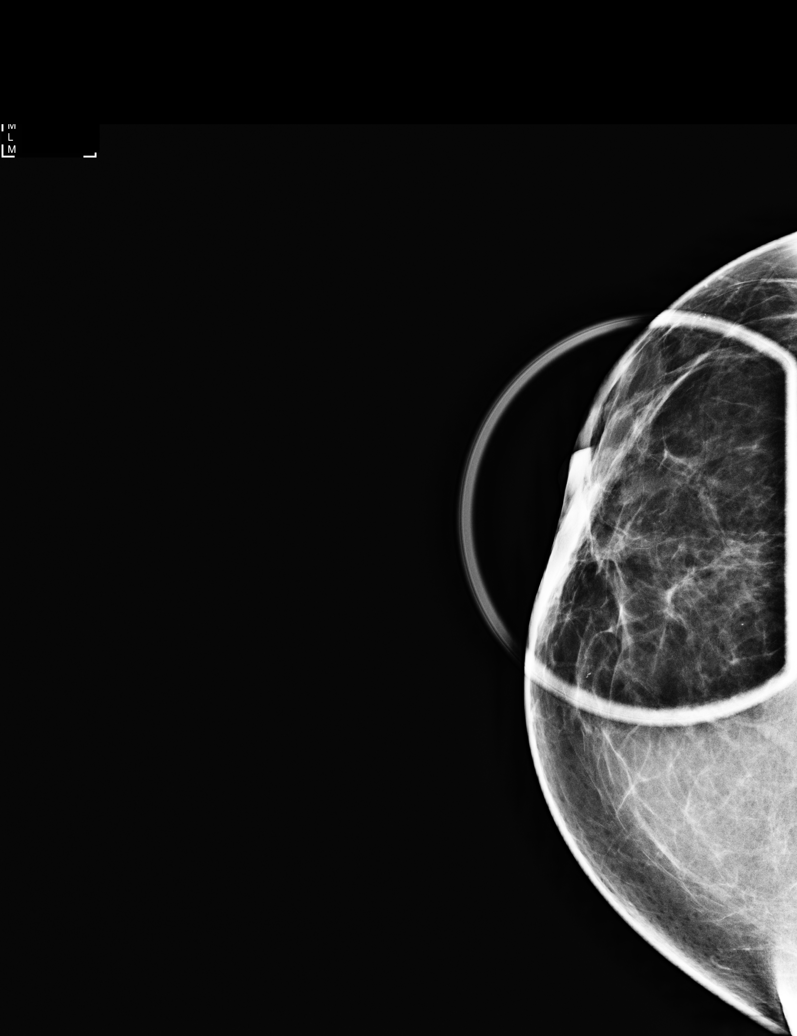

[R MLO synth-2D]
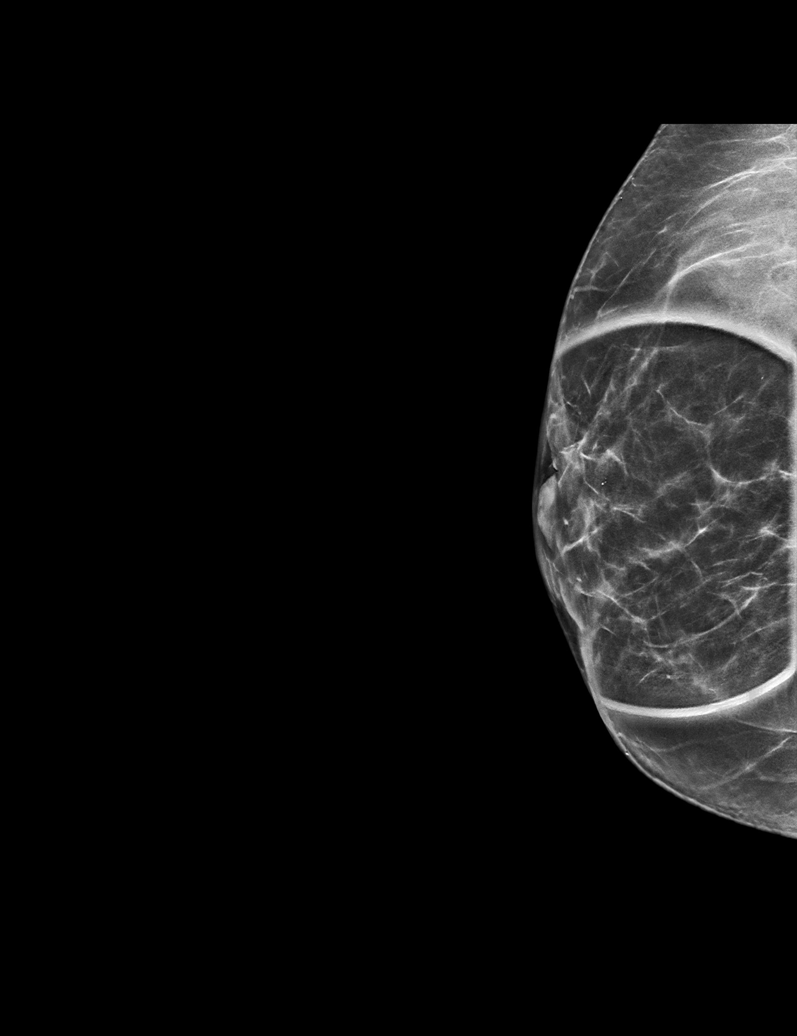

[R CC synth-2D]
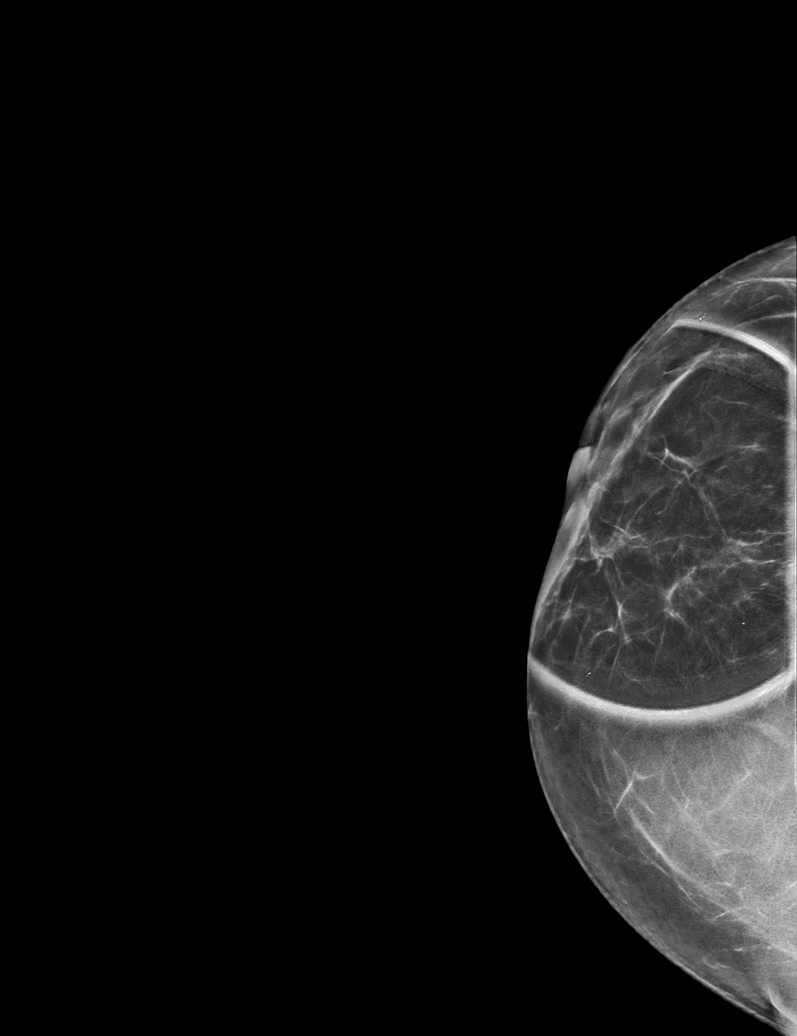

[R MLO tomo · tomo slice 33/64.0]
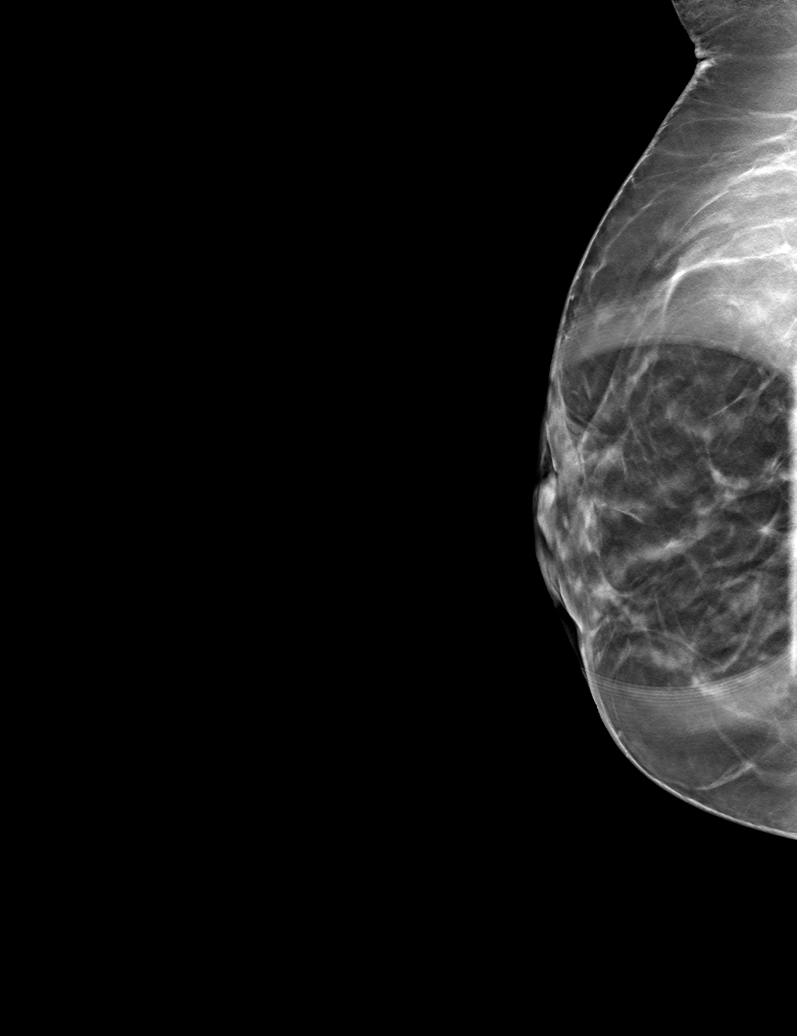

[R CC tomo · tomo slice 31/61.0]
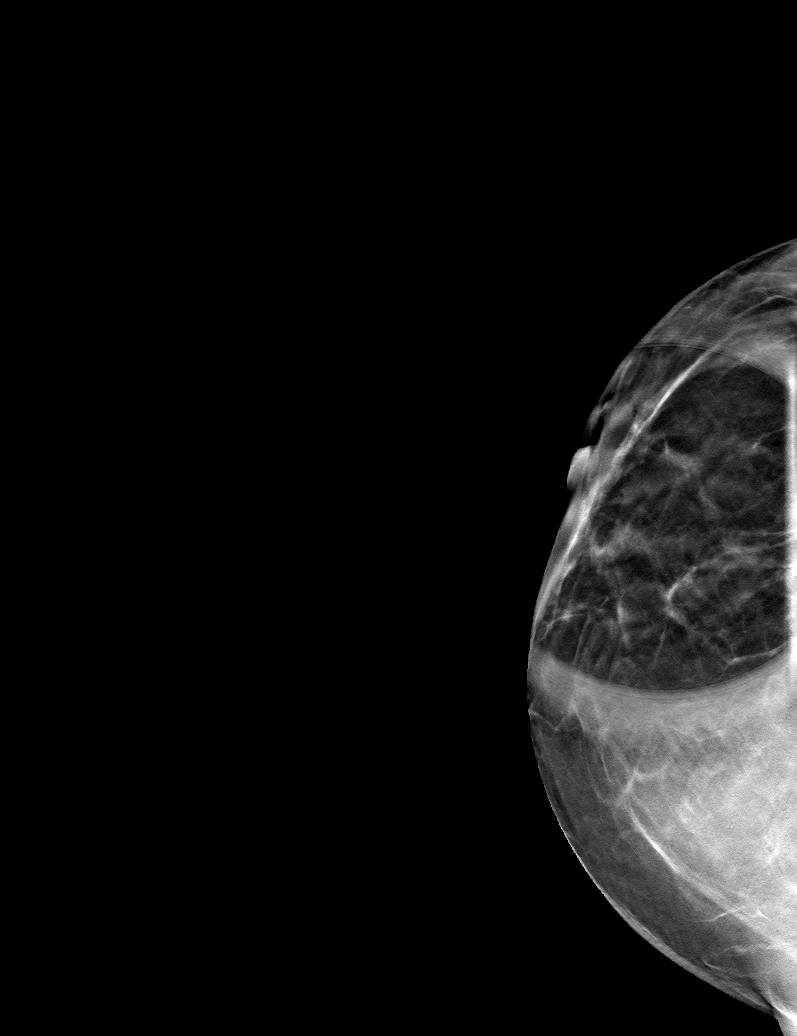

[6 of 14 positions shown; findings below may reference images not displayed]

ACR Breast Density Category b: There are scattered areas of
fibroglandular density.
FINDINGS: There is periareolar skin thickening within the right breast with
subareolar distortion at the site of postsurgical change. No
suspicious retroareolar masses are identified.

Mammographic images were processed with CAD.

On physical exam, there is a periareolar scar within the right
breast. No skin redness.

Targeted ultrasound is performed, showing normal appearing scar
tissue within the periareolar right breast without suspicious
subareolar mass or skin thickening.
IMPRESSION: No mammographic evidence for malignancy. Apparent skin thickening is
favored to be postsurgical in etiology.

RECOMMENDATION:
Screening mammogram in one year.(Code:EB-H-QG5)

I have discussed the findings and recommendations with the patient.
Results were also provided in writing at the conclusion of the
visit. If applicable, a reminder letter will be sent to the patient
regarding the next appointment.

BI-RADS CATEGORY  2: Benign.

## 2019-12-25 ENCOUNTER — Other Ambulatory Visit: Payer: Self-pay | Admitting: Surgery

## 2020-03-15 ENCOUNTER — Other Ambulatory Visit: Payer: Self-pay

## 2020-03-15 ENCOUNTER — Encounter: Payer: Self-pay | Admitting: Plastic Surgery

## 2020-03-15 ENCOUNTER — Ambulatory Visit (INDEPENDENT_AMBULATORY_CARE_PROVIDER_SITE_OTHER): Payer: Managed Care, Other (non HMO) | Admitting: Plastic Surgery

## 2020-03-15 DIAGNOSIS — N62 Hypertrophy of breast: Secondary | ICD-10-CM | POA: Diagnosis not present

## 2020-03-15 DIAGNOSIS — G8929 Other chronic pain: Secondary | ICD-10-CM

## 2020-03-15 DIAGNOSIS — M542 Cervicalgia: Secondary | ICD-10-CM

## 2020-03-15 DIAGNOSIS — N6489 Other specified disorders of breast: Secondary | ICD-10-CM | POA: Diagnosis not present

## 2020-03-15 DIAGNOSIS — M546 Pain in thoracic spine: Secondary | ICD-10-CM | POA: Diagnosis not present

## 2020-03-15 DIAGNOSIS — M549 Dorsalgia, unspecified: Secondary | ICD-10-CM | POA: Insufficient documentation

## 2020-03-15 NOTE — Progress Notes (Signed)
Patient ID: Deanna Carter, female    DOB: Dec 31, 1965, 54 y.o.   MRN: [id number]   Chief Complaint  Patient presents with  . Consult  . Breast Problem    Mammary Hyperplasia: The patient is a 54 y.o. female with a history of mammary hyperplasia for several years.  She has extremely large breasts causing symptoms that include the following: Back pain in the upper and lower back, including neck pain. She pulls or pins her bra straps to provide better lift and relief of the pressure and pain. She notices relief by holding her breast up manually.  Her shoulder straps cause grooves and pain and pressure that requires padding for relief. Pain medication is sometimes required with motrin and tylenol.  Activities that are hindered by enlarged breasts include: exercise and running.  She has tried supportive clothing as well as fitted bras without improvement.  Her breasts are dramatically uneven.  The patient underwent partial mastectomies of the right breast 2015.  Breast disease but not breast cancer they were papillomas.  Her asymmetry is now 6 difficult for her to fit in her bra. She has hyperpigmentation of the inframammary area on both sides.  The sternal to nipple distance on the right is 27 cm and the left is 29 cm.  The IMF distance is 14 cm.  She is 5 feet 8 inches tall and weighs 170 pounds.  Preoperative bra size = 40 D cup on the left breast.  The estimated excess breast tissue to be removed at the time of surgery = 520 grams on the left and 100 grams on the right.  Mammogram history: 2020.  Followed by Dr. Ninfa Linden.  Family history of breast cancer: Negative.  Tobacco use: None.  She is not diabetic.  She has a Teaching laboratory technician job.  She has not been to physical therapy yet.  She has not been radiated.   Review of Systems  Constitutional: Negative.  Negative for activity change and appetite change.  Eyes: Negative.   Respiratory: Negative.   Gastrointestinal: Negative.  Negative for  abdominal pain.  Endocrine: Negative.   Genitourinary: Negative.   Musculoskeletal: Positive for back pain and neck pain.  Hematological: Negative.   Psychiatric/Behavioral: Negative.     Past Medical History:  Diagnosis Date  . Generalized headaches    tension - only takes medication as needed to treat  . GERD (gastroesophageal reflux disease)   . H/O hiatal hernia     Past Surgical History:  Procedure Laterality Date  . BLADDER SUSPENSION  2005, 2009  . BREAST LUMPECTOMY  2012  . BREAST LUMPECTOMY Right 11/02/2013   Procedure: RIGHT BREAST LUMPECTOMY;  Surgeon: Harl Bowie, MD;  Location: [place];  Service: General;  Laterality: Right;  . BREAST LUMPECTOMY Right 04/20/2014   Procedure: RIGHT BREAST LUMPECTOMY;  Surgeon: Coralie Keens, MD;  Location: [place];  Service: General;  Laterality: Right;  . BREAST SURGERY  03/26/2011   Right Breast Mass  . EYE SURGERY Bilateral 1999   lasik  . MASTECTOMY, PARTIAL  03/26/2011  . TUBAL LIGATION  2005      Current Outpatient Medications:  .  hydroxychloroquine (PLAQUENIL) 200 MG tablet, Take 200 mg by mouth daily., Disp: , Rfl:  .  Upadacitinib (RINVOQ PO), Take by mouth., Disp: , Rfl:  .  acetaminophen (TYLENOL) 500 MG tablet, Take 1,000 mg by mouth daily as needed for headache. For pain, Disp: , Rfl:  .  ACZONE 5 %  topical gel, Apply 1 application topically 2 (two) times daily. For face, Disp: , Rfl:  .  Ascorbic Acid (VITAMIN C PO), Take 1 tablet by mouth 2 (two) times daily. , Disp: , Rfl:  .  azelastine (ASTELIN) 0.1 % nasal spray, Place 2 sprays into the nose. (Patient not taking: Reported on 03/15/2020), Disp: , Rfl:  .  calcium carbonate (OS-CAL) 600 MG TABS tablet, Take 600 mg by mouth 2 (two) times daily with a meal., Disp: , Rfl:  .  cycloSPORINE (RESTASIS) 0.05 % ophthalmic emulsion, Place 1 drop into both eyes daily.  (Patient not taking: Reported on 03/15/2020), Disp: , Rfl:  .  diazepam (VALIUM) 5  MG tablet, Take 5 mg by mouth as needed., Disp: , Rfl:  .  gabapentin (NEURONTIN) 100 MG capsule, Take 1 capsule (100 mg total) by mouth daily. 1 po at night for 1 week then 2 caps at night (Patient not taking: Reported on 03/15/2020), Disp: 60 capsule, Rfl: 6 .  ibuprofen (ADVIL,MOTRIN) 200 MG tablet, Take 600 mg by mouth daily as needed (for headaches). For pain, Disp: , Rfl:  .  loratadine (CLARITIN) 10 MG tablet, Take 10 mg by mouth daily., Disp: , Rfl:  .  mometasone (NASONEX) 50 MCG/ACT nasal spray, Place 2 sprays into the nose., Disp: , Rfl:  .  Norgestimate-Ethinyl Estradiol Triphasic 0.18/0.215/0.25 MG-35 MCG tablet, , Disp: , Rfl:  .  olopatadine (PATANOL) 0.1 % ophthalmic solution, Place 1 drop into both eyes 2 (two) times daily.  (Patient not taking: Reported on 03/15/2020), Disp: , Rfl:  .  omeprazole (PRILOSEC) 20 MG capsule, Take 20 mg by mouth every morning.  (Patient not taking: Reported on 03/15/2020), Disp: , Rfl:  .  OVER THE COUNTER MEDICATION, Take 1 packet by mouth daily as needed (for headaches). Goodies Powder as needed for pain, Disp: , Rfl:  .  OVER THE COUNTER MEDICATION, Take 1 capsule by mouth 2 (two) times daily. Bio Tears, Disp: , Rfl:  .  Probiotic Product (PROBIOTIC DAILY PO), Take 1 tablet by mouth daily., Disp: , Rfl:  .  SUMAtriptan (IMITREX) 100 MG tablet, Take 1 tablet (100 mg total) by mouth 2 (two) times a week. No more than 2 in 24 hour period, Disp: 27 tablet, Rfl: 2 .  VITAMIN D, CHOLECALCIFEROL, PO, Take 1 tablet by mouth daily. , Disp: , Rfl:  .  zolpidem (AMBIEN) 10 MG tablet, Take 10 mg by mouth at bedtime. For sleep (Patient not taking: Reported on 03/15/2020), Disp: , Rfl:    Objective:   Vitals:   03/15/20 1319  BP: 133/89  Pulse: 72  Temp: 97.8 F (36.6 C)  SpO2: 99%    Physical Exam Vitals and nursing note reviewed.  Constitutional:      Appearance: Normal appearance.  HENT:     Head: Normocephalic and atraumatic.  Eyes:      Extraocular Movements: Extraocular movements intact.  Cardiovascular:     Rate and Rhythm: Normal rate.     Pulses: Normal pulses.  Pulmonary:     Effort: Pulmonary effort is normal. No respiratory distress.  Abdominal:     General: Abdomen is flat. There is no distension.     Tenderness: There is no abdominal tenderness.  Neurological:     General: No focal deficit present.     Mental Status: She is alert and oriented to person, place, and time.  Psychiatric:        Mood and Affect: Mood normal.  Behavior: Behavior normal.        Judgment: Judgment normal.     Assessment & Plan:  Postoperative breast asymmetry  Neck pain  Chronic bilateral thoracic back pain  Symptomatic mammary hypertrophy  The patient is a very good candidate for breast reduction on the left breast and a mastopexy on the right.  I think this would dramatically improve her significant asymmetry.  Due to her neck and back pain I will send her.  She knows to give [place] a call when she has finished the therapy.  She is hoping to get the surgery before the end of the year and we will try but she is aware we are currently booked.  Pictures were obtained of the patient and placed in the chart with the patient's or guardian's permission.  [place], DO

## 2020-04-03 ENCOUNTER — Other Ambulatory Visit: Payer: Self-pay

## 2020-04-03 ENCOUNTER — Encounter: Payer: Self-pay | Admitting: Physical Therapy

## 2020-04-03 ENCOUNTER — Ambulatory Visit: Payer: Managed Care, Other (non HMO) | Attending: Plastic Surgery | Admitting: Physical Therapy

## 2020-04-03 DIAGNOSIS — M546 Pain in thoracic spine: Secondary | ICD-10-CM | POA: Diagnosis present

## 2020-04-03 DIAGNOSIS — M542 Cervicalgia: Secondary | ICD-10-CM | POA: Diagnosis not present

## 2020-04-03 DIAGNOSIS — R293 Abnormal posture: Secondary | ICD-10-CM

## 2020-04-03 NOTE — Therapy (Signed)
[facility] [address]  [place], [place], [address] Phone: [phone]   Fax:  [phone]  Physical Therapy Evaluation  Patient Details  Name: Deanna Carter MRN: [id number] Date of Birth: [birth year] Referring Provider (PT): Audelia Hives, DO   Encounter Date: 04/03/2020   PT End of Session - 04/03/20 1103    Visit Number 1    Number of Visits 13    Date for PT Re-Evaluation 05/15/20    Authorization Type [employer]    Authorization - Number of Visits 30    PT Start Time 1018    PT Stop Time 1053    PT Time Calculation (min) 35 min    Activity Tolerance Patient tolerated treatment well    Behavior During Therapy [facility] for tasks assessed/performed           Past Medical History:  Diagnosis Date  . Generalized headaches    tension - only takes medication as needed to treat  . GERD (gastroesophageal reflux disease)   . H/O hiatal hernia     Past Surgical History:  Procedure Laterality Date  . BLADDER SUSPENSION  2005, 2009  . BREAST LUMPECTOMY  2012  . BREAST LUMPECTOMY Right 11/02/2013   Procedure: RIGHT BREAST LUMPECTOMY;  Surgeon: Harl Bowie, MD;  Location: [place];  Service: General;  Laterality: Right;  . BREAST LUMPECTOMY Right 04/20/2014   Procedure: RIGHT BREAST LUMPECTOMY;  Surgeon: Coralie Keens, MD;  Location: [place];  Service: General;  Laterality: Right;  . BREAST SURGERY  03/26/2011   Right Breast Mass  . EYE SURGERY Bilateral 1999   lasik  . MASTECTOMY, PARTIAL  03/26/2011  . TUBAL LIGATION  2005    There were no vitals filed for this visit.    Subjective Assessment - 04/03/20 1020    Subjective Patient reporting neck and upper back pain for about a decade. Denies inciting trauma, has just gotten worse over time. neck pain occurs along B sides of the posterior neck and "it feels like my neck is in a vice." Denies N/T or radiation. Thoracic pain occurs  along the midline. Worse in the AM. Also has posterior headaches daily. Seeing a chiropractor since 2004 for the same problem. Better with chiropractor, heat, cold, pain meds.    Pertinent History hiatal hernia, GERD, HA, R breast partial mastectomy 2015    Limitations House hold activities    Diagnostic tests none recent    Patient Stated Goals improve pain    Currently in Pain? Yes    Pain Score 6     Pain Location Head    Pain Orientation Right;Left;Posterior    Pain Descriptors / Indicators Aching    Pain Type Chronic pain    Pain Radiating Towards from B posterior neck to the base of the skull    Multiple Pain Sites Yes    Pain Score 0    Pain Location Back    Pain Orientation Upper    Pain Descriptors / Indicators --   stiff   Pain Type Chronic pain              [facility] PT Assessment - 04/03/20 1026      Assessment   Medical Diagnosis Postoperative breast asymmetry, neck pain, chronic thoracic back pain, symptomatic mammary asymmetric     Referring Provider (PT) Audelia Hives, DO    Onset Date/Surgical Date 05/08/20    Hand Dominance Right    Prior  Therapy yes      Precautions   Precautions None      Balance Screen   Has the patient fallen in the past 6 months No    Has the patient had a decrease in activity level because of a fear of falling?  No    Is the patient reluctant to leave their home because of a fear of falling?  No      Home Ecologist residence    Living Arrangements Spouse/significant other    Available Help at Discharge Family    Type of Carl      Prior Function   Level of Independence Independent    Vocation Full time employment    Charity fundraiser- computer work in sitting/standing    Leisure none      Cognition   Overall Cognitive Status Within Functional Limits for tasks assessed      Sensation   Light Touch Appears Intact      Coordination   Gross Motor Movements are  Fluid and Coordinated Yes      Posture/Postural Control   Posture/Postural Control Postural limitations    Postural Limitations Rounded Shoulders   elevated shoulder     ROM / Strength   AROM / PROM / Strength AROM;Strength      AROM   AROM Assessment Site Cervical;Thoracic    Cervical Flexion 34    Cervical Extension 20   pain   Cervical - Right Side Bend 30   discomfort   Cervical - Left Side Bend 25   discomfort   Cervical - Right Rotation 28   pain   Cervical - Left Rotation 42   pain   Thoracic Flexion WFL    Thoracic Extension moderately limited   pain   Thoracic - Right Side Bend WFL    Thoracic - Left Side Bend [facility]    Thoracic - Right Rotation moderately limited    Thoracic - Left Rotation moderately limited      Strength   Strength Assessment Site Shoulder    Right/Left Shoulder Right;Left    Right Shoulder Flexion 4+/5    Right Shoulder ABduction 4+/5    Right Shoulder Internal Rotation 4+/5    Right Shoulder External Rotation 4+/5    Left Shoulder Flexion 4+/5    Left Shoulder ABduction 4+/5    Left Shoulder Internal Rotation 4+/5    Left Shoulder External Rotation 4+/5      Palpation   Spinal mobility very TTP and hypomobile along midline of cervical and thoracic spine; WFL in lumbar spine    Palpation comment very TTP and soft tissue restriction throughout cervicall, shoulder, and periscapular musculature, L>R      Ambulation/Gait   Assistive device None    Gait Pattern Within Functional Limits    Ambulation Surface Level;Indoor    Gait velocity WFL                      Objective measurements completed on examination: See above findings.               PT Education - 04/03/20 1102    Education Details prognosis, POC, HEP    Person(s) Educated Patient    Methods Explanation;Demonstration;Tactile cues;Verbal cues;Handout    Comprehension Verbalized understanding;Returned demonstration            PT Short Term Goals -  04/03/20 1151      PT SHORT TERM  GOAL #1   Title Patient to be independent with initial HEP.    Time 3    Period Weeks    Status New    Target Date 04/24/20             PT Long Term Goals - 04/03/20 1151      PT LONG TERM GOAL #1   Title Patient to be independent with advanced HEP.    Time 6    Period Weeks    Status New    Target Date 05/15/20      PT LONG TERM GOAL #2   Title Patient to demonstrate cervical and thoracic AROM WFL and without pain limiting.    Time 6    Period Weeks    Status New    Target Date 05/15/20      PT LONG TERM GOAL #3   Title Patient to report improvement 50% in frequency and intensity of HAs.    Time 6    Period Weeks    Status New    Target Date 05/15/20      PT LONG TERM GOAL #4   Title Patient to report 80% improvement in pain levels.    Time 6    Period Weeks    Status New    Target Date 05/15/20                  Plan - 04/03/20 1103    Clinical Impression Statement Patient is a 54 y/o F presenting to OPPT with c/o chronic insidious cervical and thoracic pain for the past decade. Neck pain occurs over B sides of the posterior neck radiating to occipital region and with thoracic pain occurring over the midline. Denies N/T or radiation. Patient unable to identify specific aggravating factors. Patient today presenting with rounded and elevated shoulders, limited and painful cervical and thoracic AROM, good shoulder strength, pain and hypomobility with cervical and thoracic PAs, and significant soft tissue restriction throughout the shoulder, periscapular, and cervical musculature, L>R. Patient was educated on gentle mobility HEP- patient reported understanding. Would benefit from skilled PT services 2x/week for 6 weeks to address aforementioned impairments.    Personal Factors and Comorbidities Age;Comorbidity 3+;Past/Current Experience;Profession;Time since onset of injury/illness/exacerbation    Comorbidities hiatal hernia,  GERD, HA, R breast partial mastectomy 2015    Examination-Activity Limitations Carry;Lift;Reach Overhead    Examination-Participation Restrictions Cleaning;Shop;Community Activity;Driving;Yard Work;Laundry;Occupation;Meal Prep    Stability/Clinical Decision Making Stable/Uncomplicated    Clinical Decision Making Low    Rehab Potential Good    PT Frequency 2x / week    PT Duration 6 weeks    PT Treatment/Interventions ADLs/Self Care Home Management;Cryotherapy;Electrical Stimulation;Moist Heat;Therapeutic exercise;Therapeutic activities;Functional mobility training;Stair training;Gait training;Ultrasound;Neuromuscular re-education;Patient/family education;Manual techniques;Taping;Energy conservation;Dry needling;Passive range of motion    PT Next Visit Plan reassess HEP; cervical and thoracic jt mobs, progress ROM    Consulted and Agree with Plan of Care Patient           Patient will benefit from skilled therapeutic intervention in order to improve the following deficits and impairments:  Hypomobility, Decreased activity tolerance, Decreased strength, Increased fascial restricitons, Pain, Improper body mechanics, Decreased range of motion, Impaired flexibility, Postural dysfunction  Visit Diagnosis: Cervicalgia  Pain in thoracic spine  Abnormal posture     Problem List Patient Active Problem List   Diagnosis Date Noted  . Postoperative breast asymmetry 03/15/2020  . Neck pain 03/15/2020  . Back pain 03/15/2020  . Symptomatic mammary hypertrophy 03/15/2020  .  Migraine 12/01/2012  . Breast mass, right 03/02/2011      Janene Harvey, PT, DPT 04/03/20 11:57 AM   [facility] [address]  [place], [place], [address] Phone: [phone]   Fax:  [phone]  Name: JOELY LOSIER MRN: [id number] Date of Birth: [birth year]

## 2020-04-09 ENCOUNTER — Ambulatory Visit (INDEPENDENT_AMBULATORY_CARE_PROVIDER_SITE_OTHER): Payer: Managed Care, Other (non HMO) | Admitting: Surgical

## 2020-04-09 ENCOUNTER — Other Ambulatory Visit: Payer: Self-pay

## 2020-04-09 ENCOUNTER — Encounter: Payer: Self-pay | Admitting: Surgical

## 2020-04-09 VITALS — BP 156/92 | HR 71 | Temp 98.3°F | Ht 68.0 in | Wt 176.2 lb

## 2020-04-09 DIAGNOSIS — M542 Cervicalgia: Secondary | ICD-10-CM

## 2020-04-09 DIAGNOSIS — N62 Hypertrophy of breast: Secondary | ICD-10-CM

## 2020-04-09 DIAGNOSIS — M546 Pain in thoracic spine: Secondary | ICD-10-CM

## 2020-04-09 DIAGNOSIS — N6489 Other specified disorders of breast: Secondary | ICD-10-CM

## 2020-04-09 DIAGNOSIS — G8929 Other chronic pain: Secondary | ICD-10-CM

## 2020-04-09 MED ORDER — HYDROCODONE-ACETAMINOPHEN 5-325 MG PO TABS
1.0000 | ORAL_TABLET | Freq: Four times a day (QID) | ORAL | 0 refills | Status: AC | PRN
Start: 2020-04-09 — End: 2020-04-14

## 2020-04-09 MED ORDER — ONDANSETRON HCL 4 MG PO TABS: 4.0000 mg | ORAL_TABLET | Freq: Three times a day (TID) | ORAL | 0 refills | Status: DC | PRN

## 2020-04-09 MED ORDER — CEPHALEXIN 500 MG PO CAPS: 500.0000 mg | ORAL_CAPSULE | Freq: Four times a day (QID) | ORAL | 0 refills | Status: AC

## 2020-04-09 NOTE — H&P (View-Only) (Signed)
Patient ID: Deanna Carter, female    DOB: February 15, 1966, 54 y.o.   MRN: [id number]  Chief Complaint  Patient presents with   Pre-op Exam      ICD-10-CM   1. Postoperative breast asymmetry  N64.89   2. Chronic bilateral thoracic back pain  M54.6    G89.29   3. Symptomatic mammary hypertrophy  N62   4. Neck pain  M54.2      History of Present Illness: Deanna Carter is a 54 y.o.  female  with a history of mammary hypertrophy.  She presents for preoperative evaluation for upcoming procedure, left breast reduction, right breast mastopexy and possible bilateral liposuction to lateral breasts, scheduled for 05/08/2020 with Dr. Marla Roe  The patient has not had problems with anesthesia. No history of DVT/PE.  No family history of DVT/PE.  No family or personal history of bleeding or clotting disorders.  Patient is not currently taking any blood thinners.  No history of CVA/MI.   Summary of Previous Visit: Patient's breasts are dramatically uneven, patient underwent partial mastectomies of the right breast in 2015 for papillomas.  STN on the right is 27 cm and STN on the left is 29 cm.  Preoperative bra size is 40D cup.  Estimated excess breast tissue to be with the time of surgery is 520 g on the left and 100 g on the right.  Patient is a nondiabetic, non-smoker.  Job: Teaching laboratory technician job  PMH Significant for: Right breast papillomas -breast lumpectomy.  Patient has a history of rheumatoid arthritis.  Patient is currently on Rinvoq and Plaquenil for rheumatoid arthritis.  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    She reports she has been feeling well lately.   Past Medical History: Allergies: Allergies  Allergen Reactions   Gabapentin     Current Medications:  Current Outpatient Medications:    acetaminophen (TYLENOL) 500 MG tablet, Take 1,000 mg by mouth daily as needed for headache. For pain, Disp: , Rfl:    ACZONE 5 % topical gel, Apply 1  application topically 2 (two) times daily. For face, Disp: , Rfl:    Ascorbic Acid (VITAMIN C PO), Take 1 tablet by mouth 2 (two) times daily. , Disp: , Rfl:    azelastine (ASTELIN) 0.1 % nasal spray, Place 2 sprays into the nose. , Disp: , Rfl:    calcium carbonate (OS-CAL) 600 MG TABS tablet, Take 600 mg by mouth 2 (two) times daily with a meal., Disp: , Rfl:    cycloSPORINE (RESTASIS) 0.05 % ophthalmic emulsion, Place 1 drop into both eyes daily. , Disp: , Rfl:    diazepam (VALIUM) 5 MG tablet, Take 5 mg by mouth as needed., Disp: , Rfl:    gabapentin (NEURONTIN) 100 MG capsule, Take 1 capsule (100 mg total) by mouth daily. 1 po at night for 1 week then 2 caps at night, Disp: 60 capsule, Rfl: 6   hydroxychloroquine (PLAQUENIL) 200 MG tablet, Take 200 mg by mouth daily., Disp: , Rfl:    ibuprofen (ADVIL,MOTRIN) 200 MG tablet, Take 600 mg by mouth daily as needed (for headaches). For pain, Disp: , Rfl:    loratadine (CLARITIN) 10 MG tablet, Take 10 mg by mouth daily., Disp: , Rfl:    Norgestimate-Ethinyl Estradiol Triphasic 0.18/0.215/0.25 MG-35 MCG tablet, , Disp: , Rfl:    olopatadine (PATANOL) 0.1 % ophthalmic solution, Place 1 drop into both eyes 2 (two) times daily. , Disp: ,  Rfl:    omeprazole (PRILOSEC) 20 MG capsule, Take 20 mg by mouth every morning. , Disp: , Rfl:    OVER THE COUNTER MEDICATION, Take 1 packet by mouth daily as needed (for headaches). Goodies Powder as needed for pain, Disp: , Rfl:    OVER THE COUNTER MEDICATION, Take 1 capsule by mouth 2 (two) times daily. Bio Tears , Disp: , Rfl:    Probiotic Product (PROBIOTIC DAILY PO), Take 1 tablet by mouth daily., Disp: , Rfl:    SUMAtriptan (IMITREX) 100 MG tablet, Take 1 tablet (100 mg total) by mouth 2 (two) times a week. No more than 2 in 24 hour period, Disp: 27 tablet, Rfl: 2   Upadacitinib (RINVOQ PO), Take by mouth., Disp: , Rfl:    VITAMIN D, CHOLECALCIFEROL, PO, Take 1 tablet by mouth daily. , Disp: ,  Rfl:    zolpidem (AMBIEN) 10 MG tablet, Take 10 mg by mouth at bedtime. For sleep, Disp: , Rfl:    cephALEXin (KEFLEX) 500 MG capsule, Take 1 capsule (500 mg total) by mouth 4 (four) times daily for 3 days., Disp: 12 capsule, Rfl: 0   HYDROcodone-acetaminophen (NORCO) 5-325 MG tablet, Take 1 tablet by mouth every 6 (six) hours as needed for up to 5 days for severe pain., Disp: 20 tablet, Rfl: 0   mometasone (NASONEX) 50 MCG/ACT nasal spray, Place 2 sprays into the nose., Disp: , Rfl:    ondansetron (ZOFRAN) 4 MG tablet, Take 1 tablet (4 mg total) by mouth every 8 (eight) hours as needed for nausea or vomiting., Disp: 20 tablet, Rfl: 0  Past Medical Problems: Past Medical History:  Diagnosis Date   Generalized headaches    tension - only takes medication as needed to treat   GERD (gastroesophageal reflux disease)    H/O hiatal hernia     Past Surgical History: Past Surgical History:  Procedure Laterality Date   BLADDER SUSPENSION  2005, 2009   BREAST LUMPECTOMY  2012   BREAST LUMPECTOMY Right 11/02/2013   Procedure: RIGHT BREAST LUMPECTOMY;  Surgeon: Harl Bowie, MD;  Location: [facility];  Service: General;  Laterality: Right;   BREAST LUMPECTOMY Right 04/20/2014   Procedure: RIGHT BREAST LUMPECTOMY;  Surgeon: Coralie Keens, MD;  Location: [facility];  Service: General;  Laterality: Right;   BREAST SURGERY  03/26/2011   Right Breast Mass   EYE SURGERY Bilateral 1999   lasik   MASTECTOMY, PARTIAL  03/26/2011   TUBAL LIGATION  2005    Social History: Social History   Socioeconomic History   Marital status: Married    Spouse name: Holiday representative    Number of children: 2   Years of education: college   Highest education level: Not on file  Occupational History   Not on file  Tobacco Use   Smoking status: Never Smoker   Smokeless tobacco: Never Used  Substance and Sexual Activity   Alcohol use: Yes    Comment: rarely   Drug use: No    Sexual activity: Yes    Birth control/protection: Pill  Other Topics Concern   Not on file  Social History Narrative   Patient lives at home with husband [place].    Patient has 2 children.    Patient is currently working.    Patient has her Bachelors    Patient is right handed.   Social Determinants of Health   Financial Resource Strain:    Difficulty of Paying Living Expenses: Not on file  Food Insecurity:    Worried About Charity fundraiser in the Last Year: Not on file   [employer] of Food in the Last Year: Not on file  Transportation Needs:    Lack of Transportation (Medical): Not on file   Lack of Transportation (Non-Medical): Not on file  Physical Activity:    Days of Exercise per Week: Not on file   Minutes of Exercise per Session: Not on file  Stress:    Feeling of Stress : Not on file  Social Connections:    Frequency of Communication with Friends and Family: Not on file   Frequency of Social Gatherings with Friends and Family: Not on file   Attends Religious Services: Not on file   Active Member of Clubs or Organizations: Not on file   Attends Archivist Meetings: Not on file   Marital Status: Not on file  Intimate Partner Violence:    Fear of Current or Ex-Partner: Not on file   Emotionally Abused: Not on file   Physically Abused: Not on file   Sexually Abused: Not on file    Family History: History reviewed. No pertinent family history.  Review of Systems: Review of Systems  Constitutional: Negative.   Respiratory: Negative.   Cardiovascular: Negative.   Gastrointestinal: Negative.   Musculoskeletal: Negative.   Neurological: Negative.     Physical Exam: Vital Signs BP (!) 156/92 (BP Location: Left Arm, Patient Position: Sitting, Cuff Size: Normal)    Pulse 71    Temp 98.3 F (36.8 C) (Oral)    Ht 5\' 8"  (1.727 m)    Wt 176 lb 3.2 oz (79.9 kg)    LMP 07/02/2016    SpO2 100%    BMI 26.79 kg/m  Physical Exam Exam conducted  with a chaperone present.  Constitutional:      General: She is not in acute distress.    Appearance: Normal appearance. She is not ill-appearing.  HENT:     Head: Normocephalic and atraumatic.  Eyes:     Pupils: Pupils are equal, round Neck:     Musculoskeletal: Normal range of motion.  Cardiovascular:     Rate and Rhythm: Normal rate and regular rhythm.     Pulses: Normal pulses.     Heart sounds: Normal heart sounds. No murmur.  Pulmonary:     Effort: Pulmonary effort is normal. No respiratory distress.     Breath sounds: Normal breath sounds. No wheezing.  Abdominal:     General: Abdomen is flat. There is no distension.     Palpations: Abdomen is soft.     Tenderness: There is no abdominal tenderness.  Musculoskeletal: Normal range of motion.  Skin:    General: Skin is warm and dry.     Findings: No erythema or rash.  Neurological:     General: No focal deficit present.     Mental Status: She is alert and oriented to person, place, and time. Mental status is at baseline.     Motor: No weakness.  Psychiatric:        Mood and Affect: Mood normal.        Behavior: Behavior normal.   Assessment/Plan: The patient is scheduled for left breast reduction, right breast mastopexy and possible bilateral liposuction to lateral breasts with Dr. Marla Roe.  Risks, benefits, and alternatives of procedure discussed, questions answered and consent obtained.    Smoking Status: Non-smoker; Counseling Given?  N/A Last Mammogram: November 2021; Results: Negative per letter received from  the [facility] of [facility] imaging to patient.  Unable to review in the EMR  Caprini Score: 4 moderate; Risk Factors include: Age, BMI greater than 25, and length of planned surgery. Recommendation for mechanical prophylaxis. Encourage early ambulation.   Pictures obtained:[email or web address]   Post-op Rx sent to pharmacy: Norco, Zofran, Keflex  Patient was provided with the breast reduction and General Surgical  Risk consent document and Pain Medication Agreement prior to their appointment.  They had adequate time to read through the risk consent documents and Pain Medication Agreement. We also discussed them in person together during this preop appointment. All of their questions were answered to their satisfaction.  Recommended calling if they have any further questions.  Risk consent form and Pain Medication Agreement to be scanned into patient's chart.  The risk that can be encountered with breast reduction were discussed and include the following but not limited to these:  Breast asymmetry, fluid accumulation, firmness of the breast, inability to breast feed, loss of nipple or areola, skin loss, decrease or no nipple sensation, fat necrosis of the breast tissue, bleeding, infection, healing delay.  There are risks of anesthesia, changes to skin sensation and injury to nerves or blood vessels.  The muscle can be temporarily or permanently injured.  You may have an allergic reaction to tape, suture, glue, blood products which can result in skin discoloration, swelling, pain, skin lesions, poor healing.  Any of these can lead to the need for revisonal surgery or stage procedures.  A reduction has potential to interfere with diagnostic procedures.  Nipple or breast piercing can increase risks of infection.  This procedure is best done when the breast is fully developed.  Changes in the breast will continue to occur over time.  Pregnancy can alter the outcomes of previous breast reduction surgery, weight gain and weigh loss can also effect the long term appearance.   The risks that can be encountered with and after liposuction were discussed and include the following but no limited to these:  Asymmetry, fluid accumulation, firmness of the area, fat necrosis with death of fat tissue, bleeding, infection, delayed healing, anesthesia risks, skin sensation changes, injury to structures including nerves, blood vessels, and  muscles which may be temporary or permanent, allergies to tape, suture materials and glues, blood products, topical preparations or injected agents, skin and contour irregularities, skin discoloration and swelling, deep vein thrombosis, cardiac and pulmonary complications, pain, which may persist, persistent pain, recurrence of the lesion, poor healing of the incision, possible need for revisional surgery or staged procedures. Thiere can also be persistent swelling, poor wound healing, rippling or loose skin, worsening of cellulite, swelling, and thermal burn or heat injury from ultrasound with the ultrasound-assisted lipoplasty technique. Any change in weight fluctuations can alter the outcome.  **Medical clearance/medication hold clearance was sent to patient's rheumatologist at [facility] Dr. Jerene Pitch to see if patient could stop Rinvoq prior to surgery as this can cause delayed healing and increased chance of wounds.  Would ideally like to have patient hold Rinvoq 4 weeks preop and 4 weeks postop, however patient is scheduled for surgery in 4 weeks from today and will need to wait until clearance is returned prior to advising patient to stop medicine.  Hopefully will receive within the next week.  Patient currently takes ibuprofen and Tylenol daily for migraines, discussed with patient to stop ibuprofen 1 week prior to surgery.     Electronically signed by: Carola Rhine Keona Sheffler, PA-C 04/09/2020 3:33 PM

## 2020-04-09 NOTE — Progress Notes (Signed)
Patient ID: Deanna Carter, female    DOB: [birth year], 54 y.o.   MRN: [id number]  Chief Complaint  Patient presents with  . Pre-op Exam      ICD-10-CM   1. Postoperative breast asymmetry  N64.89   2. Chronic bilateral thoracic back pain  M54.6    G89.29   3. Symptomatic mammary hypertrophy  N62   4. Neck pain  M54.2      History of Present Illness: Deanna Carter is a 54 y.o.  female  with a history of mammary hypertrophy.  She presents for preoperative evaluation for upcoming procedure, left breast reduction, right breast mastopexy and possible bilateral liposuction to lateral breasts, scheduled for 05/08/2020 with Dr. Marla Roe  The patient has not had problems with anesthesia. No history of DVT/PE.  No family history of DVT/PE.  No family or personal history of bleeding or clotting disorders.  Patient is not currently taking any blood thinners.  No history of CVA/MI.   Summary of Previous Visit: Patient's breasts are dramatically uneven, patient underwent partial mastectomies of the right breast in 2015 for papillomas.  STN on the right is 27 cm and STN on the left is 29 cm.  Preoperative bra size is 40D cup.  Estimated excess breast tissue to be with the time of surgery is 520 g on the left and 100 g on the right.  Patient is a nondiabetic, non-smoker.  Job: Teaching laboratory technician job  PMH Significant for: Right breast papillomas -breast lumpectomy.  Patient has a history of rheumatoid arthritis.  Patient is currently on Rinvoq and Plaquenil for rheumatoid arthritis.  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    She reports she has been feeling well lately.   Past Medical History: Allergies: Allergies  Allergen Reactions  . Gabapentin     Current Medications:  Current Outpatient Medications:  .  acetaminophen (TYLENOL) 500 MG tablet, Take 1,000 mg by mouth daily as needed for headache. For pain, Disp: , Rfl:  .  ACZONE 5 % topical gel, Apply 1  application topically 2 (two) times daily. For face, Disp: , Rfl:  .  Ascorbic Acid (VITAMIN C PO), Take 1 tablet by mouth 2 (two) times daily. , Disp: , Rfl:  .  azelastine (ASTELIN) 0.1 % nasal spray, Place 2 sprays into the nose. , Disp: , Rfl:  .  calcium carbonate (OS-CAL) 600 MG TABS tablet, Take 600 mg by mouth 2 (two) times daily with a meal., Disp: , Rfl:  .  cycloSPORINE (RESTASIS) 0.05 % ophthalmic emulsion, Place 1 drop into both eyes daily. , Disp: , Rfl:  .  diazepam (VALIUM) 5 MG tablet, Take 5 mg by mouth as needed., Disp: , Rfl:  .  gabapentin (NEURONTIN) 100 MG capsule, Take 1 capsule (100 mg total) by mouth daily. 1 po at night for 1 week then 2 caps at night, Disp: 60 capsule, Rfl: 6 .  hydroxychloroquine (PLAQUENIL) 200 MG tablet, Take 200 mg by mouth daily., Disp: , Rfl:  .  ibuprofen (ADVIL,MOTRIN) 200 MG tablet, Take 600 mg by mouth daily as needed (for headaches). For pain, Disp: , Rfl:  .  loratadine (CLARITIN) 10 MG tablet, Take 10 mg by mouth daily., Disp: , Rfl:  .  Norgestimate-Ethinyl Estradiol Triphasic 0.18/0.215/0.25 MG-35 MCG tablet, , Disp: , Rfl:  .  olopatadine (PATANOL) 0.1 % ophthalmic solution, Place 1 drop into both eyes 2 (two) times daily. , Disp: ,  Rfl:  .  omeprazole (PRILOSEC) 20 MG capsule, Take 20 mg by mouth every morning. , Disp: , Rfl:  .  OVER THE COUNTER MEDICATION, Take 1 packet by mouth daily as needed (for headaches). Goodies Powder as needed for pain, Disp: , Rfl:  .  OVER THE COUNTER MEDICATION, Take 1 capsule by mouth 2 (two) times daily. Bio Tears , Disp: , Rfl:  .  Probiotic Product (PROBIOTIC DAILY PO), Take 1 tablet by mouth daily., Disp: , Rfl:  .  SUMAtriptan (IMITREX) 100 MG tablet, Take 1 tablet (100 mg total) by mouth 2 (two) times a week. No more than 2 in 24 hour period, Disp: 27 tablet, Rfl: 2 .  Upadacitinib (RINVOQ PO), Take by mouth., Disp: , Rfl:  .  VITAMIN D, CHOLECALCIFEROL, PO, Take 1 tablet by mouth daily. , Disp: ,  Rfl:  .  zolpidem (AMBIEN) 10 MG tablet, Take 10 mg by mouth at bedtime. For sleep, Disp: , Rfl:  .  cephALEXin (KEFLEX) 500 MG capsule, Take 1 capsule (500 mg total) by mouth 4 (four) times daily for 3 days., Disp: 12 capsule, Rfl: 0 .  HYDROcodone-acetaminophen (NORCO) 5-325 MG tablet, Take 1 tablet by mouth every 6 (six) hours as needed for up to 5 days for severe pain., Disp: 20 tablet, Rfl: 0 .  mometasone (NASONEX) 50 MCG/ACT nasal spray, Place 2 sprays into the nose., Disp: , Rfl:  .  ondansetron (ZOFRAN) 4 MG tablet, Take 1 tablet (4 mg total) by mouth every 8 (eight) hours as needed for nausea or vomiting., Disp: 20 tablet, Rfl: 0  Past Medical Problems: Past Medical History:  Diagnosis Date  . Generalized headaches    tension - only takes medication as needed to treat  . GERD (gastroesophageal reflux disease)   . H/O hiatal hernia     Past Surgical History: Past Surgical History:  Procedure Laterality Date  . BLADDER SUSPENSION  2005, 2009  . BREAST LUMPECTOMY  2012  . BREAST LUMPECTOMY Right 11/02/2013   Procedure: RIGHT BREAST LUMPECTOMY;  Surgeon: Harl Bowie, MD;  Location: [facility];  Service: General;  Laterality: Right;  . BREAST LUMPECTOMY Right 04/20/2014   Procedure: RIGHT BREAST LUMPECTOMY;  Surgeon: Coralie Keens, MD;  Location: [facility];  Service: General;  Laterality: Right;  . BREAST SURGERY  03/26/2011   Right Breast Mass  . EYE SURGERY Bilateral 1999   lasik  . MASTECTOMY, PARTIAL  03/26/2011  . TUBAL LIGATION  2005    Social History: Social History   Socioeconomic History  . Marital status: Married    Spouse name: Abe People   . Number of children: 2  . Years of education: college  . Highest education level: Not on file  Occupational History  . Not on file  Tobacco Use  . Smoking status: Never Smoker  . Smokeless tobacco: Never Used  Substance and Sexual Activity  . Alcohol use: Yes    Comment: rarely  . Drug use: No  .  Sexual activity: Yes    Birth control/protection: Pill  Other Topics Concern  . Not on file  Social History Narrative   Patient lives at home with husband Abe People.    Patient has 2 children.    Patient is currently working.    Patient has her Bachelors    Patient is right handed.   Social Determinants of Health   Financial Resource Strain:   . Difficulty of Paying Living Expenses: Not on file  Food Insecurity:   . Worried About Charity fundraiser in the Last Year: Not on file  . Ran Out of Food in the Last Year: Not on file  Transportation Needs:   . Lack of Transportation (Medical): Not on file  . Lack of Transportation (Non-Medical): Not on file  Physical Activity:   . Days of Exercise per Week: Not on file  . Minutes of Exercise per Session: Not on file  Stress:   . Feeling of Stress : Not on file  Social Connections:   . Frequency of Communication with Friends and Family: Not on file  . Frequency of Social Gatherings with Friends and Family: Not on file  . Attends Religious Services: Not on file  . Active Member of Clubs or Organizations: Not on file  . Attends Archivist Meetings: Not on file  . Marital Status: Not on file  Intimate Partner Violence:   . Fear of Current or Ex-Partner: Not on file  . Emotionally Abused: Not on file  . Physically Abused: Not on file  . Sexually Abused: Not on file    Family History: History reviewed. No pertinent family history.  Review of Systems: Review of Systems  Constitutional: Negative.   Respiratory: Negative.   Cardiovascular: Negative.   Gastrointestinal: Negative.   Musculoskeletal: Negative.   Neurological: Negative.     Physical Exam: Vital Signs BP (!) 156/92 (BP Location: Left Arm, Patient Position: Sitting, Cuff Size: Normal)   Pulse 71   Temp 98.3 F (36.8 C) (Oral)   Ht 5\' 8"  (1.727 m)   Wt 176 lb 3.2 oz (79.9 kg)   LMP 07/02/2016   SpO2 100%   BMI 26.79 kg/m  Physical Exam Exam conducted  with a chaperone present.  Constitutional:      General: She is not in acute distress.    Appearance: Normal appearance. She is not ill-appearing.  HENT:     Head: Normocephalic and atraumatic.  Eyes:     Pupils: Pupils are equal, round Neck:     Musculoskeletal: Normal range of motion.  Cardiovascular:     Rate and Rhythm: Normal rate and regular rhythm.     Pulses: Normal pulses.     Heart sounds: Normal heart sounds. No murmur.  Pulmonary:     Effort: Pulmonary effort is normal. No respiratory distress.     Breath sounds: Normal breath sounds. No wheezing.  Abdominal:     General: Abdomen is flat. There is no distension.     Palpations: Abdomen is soft.     Tenderness: There is no abdominal tenderness.  Musculoskeletal: Normal range of motion.  Skin:    General: Skin is warm and dry.     Findings: No erythema or rash.  Neurological:     General: No focal deficit present.     Mental Status: She is alert and oriented to person, place, and time. Mental status is at baseline.     Motor: No weakness.  Psychiatric:        Mood and Affect: Mood normal.        Behavior: Behavior normal.   Assessment/Plan: The patient is scheduled for left breast reduction, right breast mastopexy and possible bilateral liposuction to lateral breasts with Dr. Marla Roe.  Risks, benefits, and alternatives of procedure discussed, questions answered and consent obtained.    Smoking Status: Non-smoker; Counseling Given?  N/A Last Mammogram: November 2021; Results: Negative per letter received from the breast center of [facility] imaging to  patient.  Unable to review in the EMR  Caprini Score: 4 moderate; Risk Factors include: Age, BMI greater than 25, and length of planned surgery. Recommendation for mechanical prophylaxis. Encourage early ambulation.   Pictures obtained:@[email or web address]   Post-op Rx sent to pharmacy: Norco, Zofran, Keflex  Patient was provided with the breast reduction and General Surgical  Risk consent document and Pain Medication Agreement prior to their appointment.  They had adequate time to read through the risk consent documents and Pain Medication Agreement. We also discussed them in person together during this preop appointment. All of their questions were answered to their satisfaction.  Recommended calling if they have any further questions.  Risk consent form and Pain Medication Agreement to be scanned into patient's chart.  The risk that can be encountered with breast reduction were discussed and include the following but not limited to these:  Breast asymmetry, fluid accumulation, firmness of the breast, inability to breast feed, loss of nipple or areola, skin loss, decrease or no nipple sensation, fat necrosis of the breast tissue, bleeding, infection, healing delay.  There are risks of anesthesia, changes to skin sensation and injury to nerves or blood vessels.  The muscle can be temporarily or permanently injured.  You may have an allergic reaction to tape, suture, glue, blood products which can result in skin discoloration, swelling, pain, skin lesions, poor healing.  Any of these can lead to the need for revisonal surgery or stage procedures.  A reduction has potential to interfere with diagnostic procedures.  Nipple or breast piercing can increase risks of infection.  This procedure is best done when the breast is fully developed.  Changes in the breast will continue to occur over time.  Pregnancy can alter the outcomes of previous breast reduction surgery, weight gain and weigh loss can also effect the long term appearance.   The risks that can be encountered with and after liposuction were discussed and include the following but no limited to these:  Asymmetry, fluid accumulation, firmness of the area, fat necrosis with death of fat tissue, bleeding, infection, delayed healing, anesthesia risks, skin sensation changes, injury to structures including nerves, blood vessels, and  muscles which may be temporary or permanent, allergies to tape, suture materials and glues, blood products, topical preparations or injected agents, skin and contour irregularities, skin discoloration and swelling, deep vein thrombosis, cardiac and pulmonary complications, pain, which may persist, persistent pain, recurrence of the lesion, poor healing of the incision, possible need for revisional surgery or staged procedures. Thiere can also be persistent swelling, poor wound healing, rippling or loose skin, worsening of cellulite, swelling, and thermal burn or heat injury from ultrasound with the ultrasound-assisted lipoplasty technique. Any change in weight fluctuations can alter the outcome.  **Medical clearance/medication hold clearance was sent to patient's rheumatologist at [facility] Dr. Jerene Pitch to see if patient could stop Rinvoq prior to surgery as this can cause delayed healing and increased chance of wounds.  Would ideally like to have patient hold Rinvoq 4 weeks preop and 4 weeks postop, however patient is scheduled for surgery in 4 weeks from today and will need to wait until clearance is returned prior to advising patient to stop medicine.  Hopefully will receive within the next week.  Patient currently takes ibuprofen and Tylenol daily for migraines, discussed with patient to stop ibuprofen 1 week prior to surgery.     Electronically signed by: Carola Rhine Urania Pearlman, PA-C 04/09/2020 3:33 PM

## 2020-04-10 ENCOUNTER — Encounter: Payer: Self-pay | Admitting: Plastic Surgery

## 2020-04-10 NOTE — Progress Notes (Signed)
Surgical Clearance has been received from Dr. Jerene Pitch for patient's upcoming surgery left breast reduction and right breast mastopexy with Dr. Marla Roe . Medications to hold prior to surgery on 12/22 Upadacitinib (Stop taking: 2 weeks prior to surgery (12/8);  Begin retaking 2 weeks after surgery (1/5))

## 2020-04-15 ENCOUNTER — Telehealth: Payer: Self-pay

## 2020-04-15 NOTE — Telephone Encounter (Signed)
Called and spoke with the patient regarding the message below.  Asked the patient why was Deanna Carter taking the ASA.  Deanna Carter stated that her GYN told her Deanna Carter can take it just to help keep her blood thin.  Deanna Carter stated that Deanna Carter have been taking it for about (5-6 years).    Informed the patient that per [facility] Deanna Carter can stop the ASA 5 days before her surgery.  Patient verbalized understanding and agreed.//AB/CMA

## 2020-04-15 NOTE — Telephone Encounter (Signed)
Patient called to let us know that she takes 81mg  of aspirin each day.  She has surgery coming up and wants to find out when/if she needs to discontinue taking this medication.  Please call.

## 2020-04-16 ENCOUNTER — Ambulatory Visit: Payer: Managed Care, Other (non HMO)

## 2020-04-16 ENCOUNTER — Other Ambulatory Visit: Payer: Self-pay

## 2020-04-16 DIAGNOSIS — M542 Cervicalgia: Secondary | ICD-10-CM | POA: Diagnosis not present

## 2020-04-16 DIAGNOSIS — M546 Pain in thoracic spine: Secondary | ICD-10-CM

## 2020-04-16 DIAGNOSIS — R293 Abnormal posture: Secondary | ICD-10-CM

## 2020-04-16 NOTE — Therapy (Signed)
[facility] [address]  [place], [place], [address] Phone: [phone]   Fax:  [phone]  Physical Therapy Treatment  Patient Details  Name: Deanna Carter MRN: [id number] Date of Birth: 1965-06-18 Referring Provider (PT): Audelia Hives, DO   Encounter Date: 04/16/2020   PT End of Session - 04/16/20 1457    Visit Number 2    Number of Visits 13    Date for PT Re-Evaluation 05/15/20    Authorization Type [employer]    Authorization - Number of Visits 30    PT Start Time 7939    PT Stop Time 1531    PT Time Calculation (min) 44 min    Activity Tolerance Patient tolerated treatment well    Behavior During Therapy [facility] for tasks assessed/performed           Past Medical History:  Diagnosis Date  . Generalized headaches    tension - only takes medication as needed to treat  . GERD (gastroesophageal reflux disease)   . H/O hiatal hernia     Past Surgical History:  Procedure Laterality Date  . BLADDER SUSPENSION  2005, 2009  . BREAST LUMPECTOMY  2012  . BREAST LUMPECTOMY Right 11/02/2013   Procedure: RIGHT BREAST LUMPECTOMY;  Surgeon: Harl Bowie, MD;  Location: [facility];  Service: General;  Laterality: Right;  . BREAST LUMPECTOMY Right 04/20/2014   Procedure: RIGHT BREAST LUMPECTOMY;  Surgeon: Coralie Keens, MD;  Location: [facility];  Service: General;  Laterality: Right;  . BREAST SURGERY  03/26/2011   Right Breast Mass  . EYE SURGERY Bilateral 1999   lasik  . MASTECTOMY, PARTIAL  03/26/2011  . TUBAL LIGATION  2005    There were no vitals filed for this visit.   Subjective Assessment - 04/16/20 1457    Subjective Pt. doing ok.  Notes the HEP activities have been going ok.    Pertinent History hiatal hernia, GERD, HA, R breast partial mastectomy 2015    Diagnostic tests none recent    Patient Stated Goals improve pain    Currently in Pain? Yes    Pain Score 6     Pain  Location Head    Pain Orientation Right;Left;Posterior    Pain Descriptors / Indicators Aching    Pain Type Chronic pain    Multiple Pain Sites No                             [facility] Adult PT Treatment/Exercise - 04/16/20 0001      Neck Exercises: Machines for Strengthening   Cybex Row low version 20# x 15    Other Machines for Strengthening Standing lat pulldown/row 20# x 10      Neck Exercises: Theraband   Shoulder Extension 10 reps;Red    Shoulder Extension Limitations cues for scpular/retraction     Rows 15 reps;Green    Rows Limitations cues for scapular retraction     Shoulder External Rotation 10 reps    Shoulder External Rotation Limitations Hooklying on 6" foam roller     Horizontal ABduction 10 reps;Green    Horizontal ABduction Limitations Hooklying on 6" foam roll      Neck Exercises: Supine   Other Supine Exercise foam roll supine thoracic extension 5" x 10      Neck Exercises: Stretches   Levator Stretch Right;Left;1 rep;30 seconds    Levator Stretch Limitations seated  Chest Stretch Limitations gentle chest stretch laying in low position on 6" bolster on table                   PT Education - 04/16/20 1606    Education Details HEP update    Person(s) Educated Patient    Methods Explanation;Demonstration;Verbal cues;Handout    Comprehension Verbalized understanding;Returned demonstration;Verbal cues required            PT Short Term Goals - 04/16/20 1500      PT SHORT TERM GOAL #1   Title Patient to be independent with initial HEP.    Time 3    Period Weeks    Status On-going    Target Date 04/24/20             PT Long Term Goals - 04/16/20 1500      PT LONG TERM GOAL #1   Title Patient to be independent with advanced HEP.    Time 6    Period Weeks    Status On-going      PT LONG TERM GOAL #2   Title Patient to demonstrate cervical and thoracic AROM WFL and without pain limiting.    Time 6    Period Weeks     Status On-going      PT LONG TERM GOAL #3   Title Patient to report improvement 50% in frequency and intensity of HAs.    Time 6    Period Weeks    Status On-going      PT LONG TERM GOAL #4   Title Patient to report 80% improvement in pain levels.    Time 6    Period Weeks    Status On-going                 Plan - 04/16/20 1503    Clinical Impression Statement Raven reports she has had no issues with HEP.  Tolerated progression of postural strengthening and cervical stretching well today.  Pt. noting initial posterior neck pain which subsided as therex progressed.  Ended visit pain free thus modalities deferred.    Comorbidities hiatal hernia, GERD, HA, R breast partial mastectomy 2015    Rehab Potential Good    PT Frequency 2x / week    PT Duration 6 weeks    PT Treatment/Interventions ADLs/Self Care Home Management;Cryotherapy;Electrical Stimulation;Moist Heat;Therapeutic exercise;Therapeutic activities;Functional mobility training;Stair training;Gait training;Ultrasound;Neuromuscular re-education;Patient/family education;Manual techniques;Taping;Energy conservation;Dry needling;Passive range of motion    PT Next Visit Plan Cervical and thoracic jt mobs, progress ROM    Consulted and Agree with Plan of Care Patient           Patient will benefit from skilled therapeutic intervention in order to improve the following deficits and impairments:  Hypomobility, Decreased activity tolerance, Decreased strength, Increased fascial restricitons, Pain, Improper body mechanics, Decreased range of motion, Impaired flexibility, Postural dysfunction  Visit Diagnosis: Cervicalgia  Abnormal posture  Pain in thoracic spine     Problem List Patient Active Problem List   Diagnosis Date Noted  . Postoperative breast asymmetry 03/15/2020  . Neck pain 03/15/2020  . Back pain 03/15/2020  . Symptomatic mammary hypertrophy 03/15/2020  . Migraine 12/01/2012  . Breast mass, right  03/02/2011    Bess Harvest, PTA 04/16/20 4:43 PM   [facility] [address]  [place], [place], [address] Phone: [phone]   Fax:  [phone]  Name: SHEMEIKA STARZYK MRN: [id number] Date of Birth: 20-Aug-1965

## 2020-04-18 ENCOUNTER — Encounter: Payer: Self-pay | Admitting: Physical Therapy

## 2020-04-18 ENCOUNTER — Other Ambulatory Visit: Payer: Self-pay

## 2020-04-18 ENCOUNTER — Ambulatory Visit: Payer: Managed Care, Other (non HMO) | Attending: Plastic Surgery | Admitting: Physical Therapy

## 2020-04-18 DIAGNOSIS — M542 Cervicalgia: Secondary | ICD-10-CM | POA: Diagnosis present

## 2020-04-18 DIAGNOSIS — M546 Pain in thoracic spine: Secondary | ICD-10-CM | POA: Diagnosis present

## 2020-04-18 DIAGNOSIS — R293 Abnormal posture: Secondary | ICD-10-CM | POA: Insufficient documentation

## 2020-04-18 NOTE — Therapy (Signed)
[facility] [address]  [place], [place], [address] Phone: [phone]   Fax:  [phone]  Physical Therapy Treatment  Patient Details  Name: Deanna Carter MRN: [id number] Date of Birth: [birth year] Referring Provider (PT): Audelia Hives, DO   Encounter Date: 04/18/2020   PT End of Session - 04/18/20 1702    Visit Number 3    Number of Visits 13    Date for PT Re-Evaluation 05/15/20    Authorization Type [employer]    Authorization - Number of Visits 30    PT Start Time 1616    PT Stop Time 1701    PT Time Calculation (min) 45 min    Activity Tolerance Patient tolerated treatment well    Behavior During Therapy [facility] for tasks assessed/performed           Past Medical History:  Diagnosis Date  . Generalized headaches    tension - only takes medication as needed to treat  . GERD (gastroesophageal reflux disease)   . H/O hiatal hernia     Past Surgical History:  Procedure Laterality Date  . BLADDER SUSPENSION  2005, 2009  . BREAST LUMPECTOMY  2012  . BREAST LUMPECTOMY Right 11/02/2013   Procedure: RIGHT BREAST LUMPECTOMY;  Surgeon: Harl Bowie, MD;  Location: [place];  Service: General;  Laterality: Right;  . BREAST LUMPECTOMY Right 04/20/2014   Procedure: RIGHT BREAST LUMPECTOMY;  Surgeon: Coralie Keens, MD;  Location: [place];  Service: General;  Laterality: Right;  . BREAST SURGERY  03/26/2011   Right Breast Mass  . EYE SURGERY Bilateral 1999   lasik  . MASTECTOMY, PARTIAL  03/26/2011  . TUBAL LIGATION  2005    There were no vitals filed for this visit.   Subjective Assessment - 04/18/20 1617    Subjective Has been working on exercises dilligently. Yesterday was the first time she was able to take less pain medication.    Pertinent History hiatal hernia, GERD, HA, R breast partial mastectomy 2015    Diagnostic tests none recent    Patient Stated Goals improve pain      Currently in Pain? Yes    Pain Score 7     Pain Location Head    Pain Orientation Right;Left;Posterior    Pain Descriptors / Indicators Aching    Pain Type Chronic pain                             [facility] Adult PT Treatment/Exercise - 04/18/20 0001      Self-Care   Self-Care Other Self-Care Comments    Other Self-Care Comments  edu on self-STM/suboccipital release using ball on wall      Exercises   Exercises Neck;Shoulder      Neck Exercises: Machines for Strengthening   UBE (Upper Arm Bike) L2 x 3 min forward/3 min back      Manual Therapy   Manual Therapy Joint mobilization;Soft tissue mobilization;Myofascial release    Manual therapy comments prone    Joint Mobilization central and R/L unilateral PAs to thoracic and lumbar spine grade IV   good relief; most hypomobility at upper-mid thoracic spine   Soft tissue mobilization STM to B UT, LS, scalenes, suboccipitals, cervical paraspinasl    Myofascial Release manual TRP to B UT, LS, scalenes, suboccipital release  PT Short Term Goals - 04/18/20 1759      PT SHORT TERM GOAL #1   Title Patient to be independent with initial HEP.    Time 3    Period Weeks    Status Achieved    Target Date 04/24/20             PT Long Term Goals - 04/16/20 1500      PT LONG TERM GOAL #1   Title Patient to be independent with advanced HEP.    Time 6    Period Weeks    Status On-going      PT LONG TERM GOAL #2   Title Patient to demonstrate cervical and thoracic AROM WFL and without pain limiting.    Time 6    Period Weeks    Status On-going      PT LONG TERM GOAL #3   Title Patient to report improvement 50% in frequency and intensity of HAs.    Time 6    Period Weeks    Status On-going      PT LONG TERM GOAL #4   Title Patient to report 80% improvement in pain levels.    Time 6    Period Weeks    Status On-going                 Plan - 04/18/20 1702    Clinical  Impression Statement Patient reporting ability to decrease the amount of pain medication she needed to take yesterday d/t decreased pain. Worked on addressing joint hypomobility and soft tissue restriction with manual therapy. Patient with severe hypomobility in the upper-mid thoracic spine but with good relief with jt mobs. Patient also reported good relief of HA with suboccipital release, and noted complete relief of HA after MT. Worked on thoracic stretching and educated patient on self-STM for continued pain relief at home. Patient expressed interest in DN, thus will likely benefit from this modality to B UT, LS, scalenes, and suboccipitals.    Comorbidities hiatal hernia, GERD, HA, R breast partial mastectomy 2015    Rehab Potential Good    PT Frequency 2x / week    PT Duration 6 weeks    PT Treatment/Interventions ADLs/Self Care Home Management;Cryotherapy;Electrical Stimulation;Moist Heat;Therapeutic exercise;Therapeutic activities;Functional mobility training;Stair training;Gait training;Ultrasound;Neuromuscular re-education;Patient/family education;Manual techniques;Taping;Energy conservation;Dry needling;Passive range of motion    PT Next Visit Plan administer DN handout; Cervical and thoracic jt mobs, progress ROM    Consulted and Agree with Plan of Care Patient           Patient will benefit from skilled therapeutic intervention in order to improve the following deficits and impairments:  Hypomobility, Decreased activity tolerance, Decreased strength, Increased fascial restricitons, Pain, Improper body mechanics, Decreased range of motion, Impaired flexibility, Postural dysfunction  Visit Diagnosis: Cervicalgia  Abnormal posture  Pain in thoracic spine     Problem List Patient Active Problem List   Diagnosis Date Noted  . Postoperative breast asymmetry 03/15/2020  . Neck pain 03/15/2020  . Back pain 03/15/2020  . Symptomatic mammary hypertrophy 03/15/2020  . Migraine  12/01/2012  . Breast mass, right 03/02/2011     Janene Harvey, PT, DPT 04/18/20 6:01 PM   [facility] [address]  [place], [place], [address] Phone: [phone]   Fax:  [phone]  Name: KATELEEN ENCARNACION MRN: [id number] Date of Birth: 12/30/1965

## 2020-04-23 ENCOUNTER — Encounter: Payer: Managed Care, Other (non HMO) | Admitting: Physical Therapy

## 2020-04-30 ENCOUNTER — Ambulatory Visit: Payer: Managed Care, Other (non HMO) | Admitting: Physical Therapy

## 2020-04-30 ENCOUNTER — Other Ambulatory Visit: Payer: Self-pay

## 2020-04-30 ENCOUNTER — Encounter: Payer: Self-pay | Admitting: Physical Therapy

## 2020-04-30 DIAGNOSIS — M542 Cervicalgia: Secondary | ICD-10-CM | POA: Diagnosis not present

## 2020-04-30 DIAGNOSIS — R293 Abnormal posture: Secondary | ICD-10-CM

## 2020-04-30 DIAGNOSIS — M546 Pain in thoracic spine: Secondary | ICD-10-CM

## 2020-04-30 NOTE — Therapy (Signed)
[facility] [address]  [place], [place], [address] Phone: [phone]   Fax:  [phone]  Physical Therapy Treatment  Patient Details  Name: Deanna Carter MRN: [id number] Date of Birth: Oct 05, 1965 Referring Provider (PT): Audelia Hives, DO   Encounter Date: 04/30/2020   PT End of Session - 04/30/20 1105    Visit Number 4    Number of Visits 13    Date for PT Re-Evaluation 05/15/20    Authorization Type [employer]    Authorization - Number of Visits 30    PT Start Time 1025   pt late   PT Stop Time 1103    PT Time Calculation (min) 38 min    Activity Tolerance Patient tolerated treatment well    Behavior During Therapy [facility] for tasks assessed/performed           Past Medical History:  Diagnosis Date  . Generalized headaches    tension - only takes medication as needed to treat  . GERD (gastroesophageal reflux disease)   . H/O hiatal hernia     Past Surgical History:  Procedure Laterality Date  . BLADDER SUSPENSION  2005, 2009  . BREAST LUMPECTOMY  2012  . BREAST LUMPECTOMY Right 11/02/2013   Procedure: RIGHT BREAST LUMPECTOMY;  Surgeon: Harl Bowie, MD;  Location: [facility];  Service: General;  Laterality: Right;  . BREAST LUMPECTOMY Right 04/20/2014   Procedure: RIGHT BREAST LUMPECTOMY;  Surgeon: Coralie Keens, MD;  Location: [facility];  Service: General;  Laterality: Right;  . BREAST SURGERY  03/26/2011   Right Breast Mass  . EYE SURGERY Bilateral 1999   lasik  . MASTECTOMY, PARTIAL  03/26/2011  . TUBAL LIGATION  2005    There were no vitals filed for this visit.   Subjective Assessment - 04/30/20 1025    Subjective Had to help her mother after a fall and had to assist with moving her- having more pain and muscle tension in the shoulders as a result. Requesting to focus on MT today.    Pertinent History hiatal hernia, GERD, HA, R breast partial mastectomy 2015     Diagnostic tests none recent    Patient Stated Goals improve pain    Currently in Pain? Yes    Pain Score 8     Pain Location Head    Pain Orientation Right;Left;Posterior    Pain Descriptors / Indicators Aching    Pain Type Chronic pain                             [facility] Adult PT Treatment/Exercise - 04/30/20 0001      Neck Exercises: Machines for Strengthening   UBE (Upper Arm Bike) L2 x 2 min forward/2 min back      Manual Therapy   Manual Therapy Soft tissue mobilization;Myofascial release;Passive ROM    Manual therapy comments sititng/ supine    Soft tissue mobilization STM to B scalenes, UT, LS, suboccipitals    Myofascial Release manual TRP to B UT, LS, scalenes, suboccipital release    Passive ROM R/L suboccipital stretch 2x30" each                    PT Short Term Goals - 04/18/20 1759      PT SHORT TERM GOAL #1   Title Patient to be independent with initial HEP.    Time 3  Period Weeks    Status Achieved    Target Date 04/24/20             PT Long Term Goals - 04/16/20 1500      PT LONG TERM GOAL #1   Title Patient to be independent with advanced HEP.    Time 6    Period Weeks    Status On-going      PT LONG TERM GOAL #2   Title Patient to demonstrate cervical and thoracic AROM WFL and without pain limiting.    Time 6    Period Weeks    Status On-going      PT LONG TERM GOAL #3   Title Patient to report improvement 50% in frequency and intensity of HAs.    Time 6    Period Weeks    Status On-going      PT LONG TERM GOAL #4   Title Patient to report 80% improvement in pain levels.    Time 6    Period Weeks    Status On-going                 Plan - 04/30/20 1105    Clinical Impression Statement Patient arrived to session with report of intense HA and shoulder pain after helping her mother move over the weekend. Requested focus on MT today to address HA. Patient tolerated STM and manual TPR to B cervical  and superior shoulder musculature. Patient with particular tightness in the R suboccipitals and R UT/LS today. Noted good relief after STM and passive suboccipital stretching. Patient without complaints at end of session. Plan for DN next session to appropriate musculature.    Comorbidities hiatal hernia, GERD, HA, R breast partial mastectomy 2015    Rehab Potential Good    PT Frequency 2x / week    PT Duration 6 weeks    PT Treatment/Interventions ADLs/Self Care Home Management;Cryotherapy;Electrical Stimulation;Moist Heat;Therapeutic exercise;Therapeutic activities;Functional mobility training;Stair training;Gait training;Ultrasound;Neuromuscular re-education;Patient/family education;Manual techniques;Taping;Energy conservation;Dry needling;Passive range of motion    PT Next Visit Plan administer DN handout; Cervical and thoracic jt mobs, progress ROM    Consulted and Agree with Plan of Care Patient           Patient will benefit from skilled therapeutic intervention in order to improve the following deficits and impairments:  Hypomobility,Decreased activity tolerance,Decreased strength,Increased fascial restricitons,Pain,Improper body mechanics,Decreased range of motion,Impaired flexibility,Postural dysfunction  Visit Diagnosis: Cervicalgia  Abnormal posture  Pain in thoracic spine     Problem List Patient Active Problem List   Diagnosis Date Noted  . Postoperative breast asymmetry 03/15/2020  . Neck pain 03/15/2020  . Back pain 03/15/2020  . Symptomatic mammary hypertrophy 03/15/2020  . Migraine 12/01/2012  . Breast mass, right 03/02/2011     Janene Harvey, PT, DPT 04/30/20 11:07 AM   [facility] [address]  [place], [place], [address] Phone: [phone]   Fax:  [phone]  Name: Deanna Carter MRN: [id number] Date of Birth: [birth year]

## 2020-05-01 ENCOUNTER — Encounter (HOSPITAL_BASED_OUTPATIENT_CLINIC_OR_DEPARTMENT_OTHER): Payer: Self-pay | Admitting: Plastic Surgery

## 2020-05-01 ENCOUNTER — Other Ambulatory Visit: Payer: Self-pay

## 2020-05-02 ENCOUNTER — Encounter: Payer: Self-pay | Admitting: Physical Therapy

## 2020-05-02 ENCOUNTER — Ambulatory Visit: Payer: Managed Care, Other (non HMO) | Admitting: Physical Therapy

## 2020-05-02 DIAGNOSIS — R293 Abnormal posture: Secondary | ICD-10-CM

## 2020-05-02 DIAGNOSIS — M542 Cervicalgia: Secondary | ICD-10-CM | POA: Diagnosis not present

## 2020-05-02 DIAGNOSIS — M546 Pain in thoracic spine: Secondary | ICD-10-CM

## 2020-05-02 NOTE — Therapy (Addendum)
[facility] [address]  [place], [place], [address] Phone: [phone]   Fax:  [phone]  Physical Therapy Treatment  Patient Details  Name: Deanna Carter MRN: [id number] Date of Birth: 1965-07-03 Referring Provider (PT): Audelia Hives, DO   Encounter Date: 05/02/2020   PT End of Session - 05/02/20 0935    Visit Number 5    Number of Visits 13    Date for PT Re-Evaluation 05/15/20    Authorization Type [employer]    Authorization - Number of Visits 30    PT Start Time 0935    PT Stop Time 1019    PT Time Calculation (min) 44 min    Activity Tolerance Patient tolerated treatment well    Behavior During Therapy [facility] for tasks assessed/performed           Past Medical History:  Diagnosis Date  . Generalized headaches    tension - only takes medication as needed to treat  . GERD (gastroesophageal reflux disease)   . H/O hiatal hernia     Past Surgical History:  Procedure Laterality Date  . BLADDER SUSPENSION  2005, 2009  . BREAST LUMPECTOMY  2012  . BREAST LUMPECTOMY Right 11/02/2013   Procedure: RIGHT BREAST LUMPECTOMY;  Surgeon: Harl Bowie, MD;  Location: [facility];  Service: General;  Laterality: Right;  . BREAST LUMPECTOMY Right 04/20/2014   Procedure: RIGHT BREAST LUMPECTOMY;  Surgeon: Coralie Keens, MD;  Location: [facility];  Service: General;  Laterality: Right;  . BREAST SURGERY  03/26/2011   Right Breast Mass  . EYE SURGERY Bilateral 1999   lasik  . MASTECTOMY, PARTIAL  03/26/2011  . TUBAL LIGATION  2005    There were no vitals filed for this visit.   Subjective Assessment - 05/02/20 0938    Subjective Pt reports headaches are a daily occurence - pt's goal is to not have to take pain meds to manage headaches.    Pertinent History hiatal hernia, GERD, HA, R breast partial mastectomy 2015    Diagnostic tests none recent    Patient Stated Goals improve pain     Currently in Pain? Yes    Pain Score 5     Pain Location Head    Pain Orientation Posterior    Pain Descriptors / Indicators Headache    Pain Type Chronic pain                             [facility] Adult PT Treatment/Exercise - 05/02/20 0935      Neck Exercises: Machines for Strengthening   UBE (Upper Arm Bike) L2 x 2 min forward/2 min back      Manual Therapy   Manual Therapy Soft tissue mobilization;Myofascial release    Manual therapy comments skilled palpation and monitoring during DN    Soft tissue mobilization STM to B subocciptals, cervical paraspinals, scalenes, UT & LS    Myofascial Release manual TRP to B UT, LS, scalenes, suboccipital release            Trigger Point Dry Needling - 05/02/20 0935    Consent Given? Yes    Education Handout Provided Yes    Muscles Treated Head and Neck Suboccipitals;Splenius capitus;Scalenes;Upper trapezius;Levator scapulae   Bilateral   Upper Trapezius Response Twitch reponse elicited;Palpable increased muscle length    Suboccipitals Response Twitch response elicited;Palpable increased muscle length    Levator Scapulae  Response Twitch response elicited;Palpable increased muscle length    Scalenes Response Twitch reponse elicited;Palpable increased muscle length    Splenius capitus Response Twitch reponse elicited;Palpable increased muscle length                PT Education - 05/02/20 0940    Education Details DN rational, procedures, outcomes, potential side effects, and recommended post-treatment exercises/activity    Person(s) Educated Patient    Methods Explanation;Handout    Comprehension Verbalized understanding            PT Short Term Goals - 04/18/20 1759      PT SHORT TERM GOAL #1   Title Patient to be independent with initial HEP.    Time 3    Period Weeks    Status Achieved    Target Date 04/24/20             PT Long Term Goals - 04/16/20 1500      PT LONG TERM GOAL #1   Title  Patient to be independent with advanced HEP.    Time 6    Period Weeks    Status On-going      PT LONG TERM GOAL #2   Title Patient to demonstrate cervical and thoracic AROM WFL and without pain limiting.    Time 6    Period Weeks    Status On-going      PT LONG TERM GOAL #3   Title Patient to report improvement 50% in frequency and intensity of HAs.    Time 6    Period Weeks    Status On-going      PT LONG TERM GOAL #4   Title Patient to report 80% improvement in pain levels.    Time 6    Period Weeks    Status On-going                 Plan - 05/02/20 0940    Clinical Impression Statement Deanna Carter reporting continued chronic headaches and expressing interest in proceeding with DN as recommended by primary PT. After explanation of DN rational, procedures, outcomes and potential side effects, patient verbalized consent to DN treatment in conjunction with manual STM/DTM and TPR to reduce ttp/muscle tension. Muscles treated include B subocciptals, cervical paraspinals, scalenes, UT & LS. DN produced normal response with good twitches elicited resulting in palpable reduction in pain/ttp and muscle tension as well as pt reporting significant reduction in headache. Pt educated to expect mild to moderate muscle soreness for up to 24-48 hrs and instructed to continue prescribed home exercise program and current activity level with pt verbalizing understanding of theses instructions.    Comorbidities hiatal hernia, GERD, HA, R breast partial mastectomy 2015    Rehab Potential Good    PT Frequency 2x / week    PT Duration 6 weeks    PT Treatment/Interventions ADLs/Self Care Home Management;Cryotherapy;Electrical Stimulation;Moist Heat;Therapeutic exercise;Therapeutic activities;Functional mobility training;Stair training;Gait training;Ultrasound;Neuromuscular re-education;Patient/family education;Manual techniques;Taping;Energy conservation;Dry needling;Passive range of motion    PT Next  Visit Plan Assess response to DN with additional DN as indicated and benefit noted;  Cervical and thoracic jt mobs, progress ROM    Consulted and Agree with Plan of Care Patient           Patient will benefit from skilled therapeutic intervention in order to improve the following deficits and impairments:  Hypomobility,Decreased activity tolerance,Decreased strength,Increased fascial restricitons,Pain,Improper body mechanics,Decreased range of motion,Impaired flexibility,Postural dysfunction  Visit Diagnosis: Cervicalgia  Abnormal posture  Pain  in thoracic spine     Problem List Patient Active Problem List   Diagnosis Date Noted  . Postoperative breast asymmetry 03/15/2020  . Neck pain 03/15/2020  . Back pain 03/15/2020  . Symptomatic mammary hypertrophy 03/15/2020  . Migraine 12/01/2012  . Breast mass, right 03/02/2011    Percival Spanish, PT, MPT 05/02/2020, 12:38 PM  [facility] [address]  [place], [place], [address] Phone: [phone]   Fax:  [phone]  Name: Deanna Carter MRN: [id number] Date of Birth: Jan 26, 1966   PHYSICAL THERAPY DISCHARGE SUMMARY  Visits from Start of Care: 5  Current functional level related to goals / functional outcomes: Unable to assess; patient did not return after undergoing unrelated surgery    Remaining deficits: Unable to assess   Education / Equipment: HEP  Plan: Patient agrees to discharge.  Patient goals were not met. Patient is being discharged due to not returning since the last visit.  ?????     Janene Harvey, PT, DPT 06/04/20 1:37 PM

## 2020-05-02 NOTE — Patient Instructions (Signed)

## 2020-05-06 ENCOUNTER — Other Ambulatory Visit (HOSPITAL_COMMUNITY)
Admission: RE | Admit: 2020-05-06 | Discharge: 2020-05-06 | Disposition: A | Discharge status: Home / Self Care | Payer: Managed Care, Other (non HMO) | Source: Ambulatory Visit | Attending: Plastic Surgery | Admitting: Plastic Surgery

## 2020-05-06 DIAGNOSIS — Z20822 Contact with and (suspected) exposure to covid-19: Secondary | ICD-10-CM | POA: Diagnosis not present

## 2020-05-06 DIAGNOSIS — Z01812 Encounter for preprocedural laboratory examination: Secondary | ICD-10-CM | POA: Diagnosis not present

## 2020-05-06 LAB — SARS CORONAVIRUS 2 (TAT 6-24 HRS): SARS Coronavirus 2: NEGATIVE

## 2020-05-08 ENCOUNTER — Encounter (HOSPITAL_BASED_OUTPATIENT_CLINIC_OR_DEPARTMENT_OTHER): Payer: Self-pay | Admitting: Plastic Surgery

## 2020-05-08 ENCOUNTER — Ambulatory Visit (HOSPITAL_BASED_OUTPATIENT_CLINIC_OR_DEPARTMENT_OTHER): Payer: Managed Care, Other (non HMO) | Admitting: Anesthesiology

## 2020-05-08 ENCOUNTER — Other Ambulatory Visit: Payer: Self-pay

## 2020-05-08 ENCOUNTER — Ambulatory Visit (HOSPITAL_BASED_OUTPATIENT_CLINIC_OR_DEPARTMENT_OTHER)
Admission: RE | Admit: 2020-05-08 | Discharge: 2020-05-08 | Disposition: A | Discharge status: Home / Self Care | Payer: Managed Care, Other (non HMO) | Attending: Plastic Surgery | Admitting: Plastic Surgery

## 2020-05-08 ENCOUNTER — Encounter (HOSPITAL_BASED_OUTPATIENT_CLINIC_OR_DEPARTMENT_OTHER)
Admission: RE | Disposition: A | Discharge status: Home / Self Care | Payer: Self-pay | Source: Home / Self Care | Attending: Plastic Surgery

## 2020-05-08 DIAGNOSIS — Z79899 Other long term (current) drug therapy: Secondary | ICD-10-CM | POA: Insufficient documentation

## 2020-05-08 DIAGNOSIS — M542 Cervicalgia: Secondary | ICD-10-CM

## 2020-05-08 DIAGNOSIS — M549 Dorsalgia, unspecified: Secondary | ICD-10-CM | POA: Insufficient documentation

## 2020-05-08 DIAGNOSIS — N62 Hypertrophy of breast: Secondary | ICD-10-CM | POA: Diagnosis not present

## 2020-05-08 DIAGNOSIS — N651 Disproportion of reconstructed breast: Secondary | ICD-10-CM

## 2020-05-08 DIAGNOSIS — M545 Low back pain, unspecified: Secondary | ICD-10-CM

## 2020-05-08 DIAGNOSIS — N6489 Other specified disorders of breast: Secondary | ICD-10-CM | POA: Insufficient documentation

## 2020-05-08 DIAGNOSIS — M954 Acquired deformity of chest and rib: Secondary | ICD-10-CM

## 2020-05-08 HISTORY — PX: MASTOPEXY: SHX5358

## 2020-05-08 HISTORY — PX: UNILATERAL BREAST REDUCTION: SHX6885

## 2020-05-08 SURGERY — MAMMOPLASTY, REDUCTION, UNILATERAL
Anesthesia: General | Site: Breast | Laterality: Right

## 2020-05-08 MED ORDER — NITROGLYCERIN 2 % TD OINT
TOPICAL_OINTMENT | TRANSDERMAL | Status: AC
  Filled 2020-05-08: qty 30

## 2020-05-08 MED ORDER — PROMETHAZINE HCL 25 MG/ML IJ SOLN
6.2500 mg | INTRAMUSCULAR | Status: DC | PRN
Start: 2020-05-08 — End: 2020-05-08

## 2020-05-08 MED ORDER — AMISULPRIDE (ANTIEMETIC) 5 MG/2ML IV SOLN: 10.0000 mg | Freq: Once | INTRAVENOUS | Status: DC | PRN

## 2020-05-08 MED ORDER — ACETAMINOPHEN 325 MG RE SUPP: 650.0000 mg | RECTAL | Status: DC | PRN

## 2020-05-08 MED ORDER — CEFAZOLIN SODIUM-DEXTROSE 2-4 GM/100ML-% IV SOLN
INTRAVENOUS | Status: AC
  Filled 2020-05-08: qty 100

## 2020-05-08 MED ORDER — NITROGLYCERIN 2 % TD OINT
TOPICAL_OINTMENT | TRANSDERMAL | Status: DC | PRN
  Administered 2020-05-08: 1 [in_us] via TOPICAL

## 2020-05-08 MED ORDER — FENTANYL CITRATE (PF) 100 MCG/2ML IJ SOLN
INTRAMUSCULAR | Status: AC
  Filled 2020-05-08: qty 2

## 2020-05-08 MED ORDER — OXYCODONE HCL 5 MG PO TABS: 5.0000 mg | ORAL_TABLET | Freq: Once | ORAL | Status: DC | PRN

## 2020-05-08 MED ORDER — MIDAZOLAM HCL 5 MG/5ML IJ SOLN
INTRAMUSCULAR | Status: DC | PRN
  Administered 2020-05-08: 2 mg via INTRAVENOUS

## 2020-05-08 MED ORDER — EPHEDRINE SULFATE 50 MG/ML IJ SOLN
INTRAMUSCULAR | Status: DC | PRN
  Administered 2020-05-08: 10 mg via INTRAVENOUS

## 2020-05-08 MED ORDER — MIDAZOLAM HCL 2 MG/2ML IJ SOLN
INTRAMUSCULAR | Status: AC
  Filled 2020-05-08: qty 2

## 2020-05-08 MED ORDER — ACETAMINOPHEN 325 MG PO TABS: 650.0000 mg | ORAL_TABLET | ORAL | Status: DC | PRN

## 2020-05-08 MED ORDER — HYDROMORPHONE HCL 1 MG/ML IJ SOLN
INTRAMUSCULAR | Status: AC
  Filled 2020-05-08: qty 0.5

## 2020-05-08 MED ORDER — ONDANSETRON HCL 4 MG/2ML IJ SOLN
INTRAMUSCULAR | Status: AC
  Filled 2020-05-08: qty 2

## 2020-05-08 MED ORDER — DEXAMETHASONE SODIUM PHOSPHATE 10 MG/ML IJ SOLN
INTRAMUSCULAR | Status: DC | PRN
  Administered 2020-05-08: 10 mg via INTRAVENOUS

## 2020-05-08 MED ORDER — OXYCODONE HCL 5 MG PO TABS
5.0000 mg | ORAL_TABLET | ORAL | Status: DC | PRN
Start: 2020-05-08 — End: 2020-05-08

## 2020-05-08 MED ORDER — OXYCODONE HCL 5 MG/5ML PO SOLN: 5.0000 mg | Freq: Once | ORAL | Status: DC | PRN

## 2020-05-08 MED ORDER — PROPOFOL 10 MG/ML IV BOLUS
INTRAVENOUS | Status: AC
  Filled 2020-05-08: qty 20

## 2020-05-08 MED ORDER — CEFAZOLIN SODIUM-DEXTROSE 2-4 GM/100ML-% IV SOLN
2.0000 g | INTRAVENOUS | Status: AC
  Administered 2020-05-08: 2 g via INTRAVENOUS

## 2020-05-08 MED ORDER — SODIUM CHLORIDE 0.9% FLUSH: 3.0000 mL | Freq: Two times a day (BID) | INTRAVENOUS | Status: DC

## 2020-05-08 MED ORDER — CHLORHEXIDINE GLUCONATE CLOTH 2 % EX PADS: 6.0000 | MEDICATED_PAD | Freq: Once | CUTANEOUS | Status: DC

## 2020-05-08 MED ORDER — PROPOFOL 10 MG/ML IV BOLUS
INTRAVENOUS | Status: DC | PRN
  Administered 2020-05-08: 150 mg via INTRAVENOUS

## 2020-05-08 MED ORDER — HYDROMORPHONE HCL 1 MG/ML IJ SOLN
0.2500 mg | INTRAMUSCULAR | Status: DC | PRN
  Administered 2020-05-08: 0.5 mg via INTRAVENOUS
  Administered 2020-05-08 (×2): 0.25 mg via INTRAVENOUS

## 2020-05-08 MED ORDER — ONDANSETRON HCL 4 MG/2ML IJ SOLN
INTRAMUSCULAR | Status: DC | PRN
  Administered 2020-05-08: 4 mg via INTRAVENOUS

## 2020-05-08 MED ORDER — FENTANYL CITRATE (PF) 100 MCG/2ML IJ SOLN
INTRAMUSCULAR | Status: DC | PRN
  Administered 2020-05-08 (×2): 50 ug via INTRAVENOUS
  Administered 2020-05-08 (×4): 25 ug via INTRAVENOUS

## 2020-05-08 MED ORDER — FENTANYL CITRATE (PF) 100 MCG/2ML IJ SOLN: 25.0000 ug | INTRAMUSCULAR | Status: DC | PRN

## 2020-05-08 MED ORDER — LIDOCAINE HCL (CARDIAC) PF 100 MG/5ML IV SOSY
PREFILLED_SYRINGE | INTRAVENOUS | Status: DC | PRN
  Administered 2020-05-08: 60 mg via INTRATRACHEAL

## 2020-05-08 MED ORDER — LACTATED RINGERS IV SOLN: INTRAVENOUS | Status: DC

## 2020-05-08 MED ORDER — LIDOCAINE-EPINEPHRINE 1 %-1:100000 IJ SOLN
INTRAMUSCULAR | Status: DC | PRN
  Administered 2020-05-08: 17.5 mL

## 2020-05-08 MED ORDER — BUPIVACAINE HCL (PF) 0.25 % IJ SOLN
INTRAMUSCULAR | Status: DC | PRN
  Administered 2020-05-08: 17.5 mL

## 2020-05-08 MED ORDER — EPHEDRINE 5 MG/ML INJ
INTRAVENOUS | Status: AC
  Filled 2020-05-08: qty 10

## 2020-05-08 MED ORDER — SODIUM CHLORIDE 0.9% FLUSH: 3.0000 mL | INTRAVENOUS | Status: DC | PRN

## 2020-05-08 MED ORDER — MEPERIDINE HCL 25 MG/ML IJ SOLN: 6.2500 mg | INTRAMUSCULAR | Status: DC | PRN

## 2020-05-08 MED ORDER — SODIUM CHLORIDE 0.9 % IV SOLN
250.0000 mL | INTRAVENOUS | Status: DC | PRN
Start: 2020-05-08 — End: 2020-05-08

## 2020-05-08 SURGICAL SUPPLY — 84 items
ADH SKN CLS APL DERMABOND .7 (GAUZE/BANDAGES/DRESSINGS) ×6
BAG DECANTER FOR FLEXI CONT (MISCELLANEOUS) ×4 IMPLANT
BINDER ABDOMINAL  9 SM 30-45 (SOFTGOODS)
BINDER ABDOMINAL 10 UNV 27-48 (MISCELLANEOUS) IMPLANT
BINDER ABDOMINAL 12 SM 30-45 (SOFTGOODS) IMPLANT
BINDER ABDOMINAL 9 SM 30-45 (SOFTGOODS) IMPLANT
BINDER BREAST LRG (GAUZE/BANDAGES/DRESSINGS) IMPLANT
BINDER BREAST MEDIUM (GAUZE/BANDAGES/DRESSINGS) IMPLANT
BINDER BREAST XLRG (GAUZE/BANDAGES/DRESSINGS) ×2 IMPLANT
BINDER BREAST XXLRG (GAUZE/BANDAGES/DRESSINGS) IMPLANT
BIOPATCH RED 1 DISK 7.0 (GAUZE/BANDAGES/DRESSINGS) IMPLANT
BLADE HEX COATED 2.75 (ELECTRODE) ×4 IMPLANT
BLADE KNIFE PERSONA 10 (BLADE) ×8 IMPLANT
BLADE SURG 10 STRL SS (BLADE) ×4 IMPLANT
BLADE SURG 15 STRL LF DISP TIS (BLADE) ×3 IMPLANT
BLADE SURG 15 STRL SS (BLADE) ×4
BNDG GAUZE ELAST 4 BULKY (GAUZE/BANDAGES/DRESSINGS) ×8 IMPLANT
CANISTER SUCT 1200ML W/VALVE (MISCELLANEOUS) ×4 IMPLANT
COVER BACK TABLE 60X90IN (DRAPES) ×4 IMPLANT
COVER MAYO STAND STRL (DRAPES) ×4 IMPLANT
COVER WAND RF STERILE (DRAPES) IMPLANT
DECANTER SPIKE VIAL GLASS SM (MISCELLANEOUS) IMPLANT
DERMABOND ADVANCED (GAUZE/BANDAGES/DRESSINGS) ×2
DERMABOND ADVANCED .7 DNX12 (GAUZE/BANDAGES/DRESSINGS) ×6 IMPLANT
DRAIN CHANNEL 19F RND (DRAIN) IMPLANT
DRAPE LAPAROSCOPIC ABDOMINAL (DRAPES) ×4 IMPLANT
DRSG OPSITE POSTOP 4X6 (GAUZE/BANDAGES/DRESSINGS) ×4 IMPLANT
DRSG PAD ABDOMINAL 8X10 ST (GAUZE/BANDAGES/DRESSINGS) ×8 IMPLANT
DRSG TEGADERM 2-3/8X2-3/4 SM (GAUZE/BANDAGES/DRESSINGS) IMPLANT
ELECT BLADE 4.0 EZ CLEAN MEGAD (MISCELLANEOUS)
ELECT REM PT RETURN 9FT ADLT (ELECTROSURGICAL) ×4
ELECTRODE BLDE 4.0 EZ CLN MEGD (MISCELLANEOUS) IMPLANT
ELECTRODE REM PT RTRN 9FT ADLT (ELECTROSURGICAL) ×3 IMPLANT
EVACUATOR SILICONE 100CC (DRAIN) IMPLANT
GAUZE SPONGE 4X4 12PLY STRL (GAUZE/BANDAGES/DRESSINGS) IMPLANT
GAUZE SPONGE 4X4 12PLY STRL LF (GAUZE/BANDAGES/DRESSINGS) IMPLANT
GLOVE BIO SURGEON STRL SZ 6.5 (GLOVE) ×12 IMPLANT
GLOVE SURG ENC TEXT LTX SZ7.5 (GLOVE) ×4 IMPLANT
GOWN STRL REUS W/ TWL LRG LVL3 (GOWN DISPOSABLE) ×9 IMPLANT
GOWN STRL REUS W/TWL LRG LVL3 (GOWN DISPOSABLE) ×12
LINER CANISTER 1000CC FLEX (MISCELLANEOUS) ×4 IMPLANT
NDL FILTER BLUNT 18X1 1/2 (NEEDLE) ×2 IMPLANT
NDL HYPO 25X1 1.5 SAFETY (NEEDLE) ×2 IMPLANT
NDL SAFETY ECLIPSE 18X1.5 (NEEDLE) ×3 IMPLANT
NDL SPNL 18GX3.5 QUINCKE PK (NEEDLE) IMPLANT
NEEDLE FILTER BLUNT 18X 1/2SAF (NEEDLE) ×1
NEEDLE FILTER BLUNT 18X1 1/2 (NEEDLE) ×3 IMPLANT
NEEDLE HYPO 18GX1.5 SHARP (NEEDLE) ×4
NEEDLE HYPO 25X1 1.5 SAFETY (NEEDLE) ×4 IMPLANT
NEEDLE SPNL 18GX3.5 QUINCKE PK (NEEDLE) IMPLANT
NS IRRIG 1000ML POUR BTL (IV SOLUTION) ×4 IMPLANT
PACK BASIN DAY SURGERY FS (CUSTOM PROCEDURE TRAY) ×4 IMPLANT
PAD ALCOHOL SWAB (MISCELLANEOUS) ×4 IMPLANT
PAD FOAM SILICONE BACKED (GAUZE/BANDAGES/DRESSINGS) IMPLANT
PENCIL SMOKE EVACUATOR (MISCELLANEOUS) ×4 IMPLANT
PIN SAFETY STERILE (MISCELLANEOUS) IMPLANT
SLEEVE SCD COMPRESS KNEE MED (MISCELLANEOUS) ×4 IMPLANT
SPONGE LAP 18X18 RF (DISPOSABLE) ×8 IMPLANT
STRIP CLOSURE SKIN 1/2X4 (GAUZE/BANDAGES/DRESSINGS) ×4 IMPLANT
STRIP SUTURE WOUND CLOSURE 1/2 (MISCELLANEOUS) ×12 IMPLANT
SUT MNCRL AB 4-0 PS2 18 (SUTURE) ×22 IMPLANT
SUT MON AB 3-0 SH 27 (SUTURE) ×16
SUT MON AB 3-0 SH27 (SUTURE) ×12 IMPLANT
SUT MON AB 5-0 PS2 18 (SUTURE) ×6 IMPLANT
SUT PDS 3-0 CT2 (SUTURE) ×8
SUT PDS AB 2-0 CT2 27 (SUTURE) IMPLANT
SUT PDS II 3-0 CT2 27 ABS (SUTURE) ×2 IMPLANT
SUT SILK 3 0 PS 1 (SUTURE) IMPLANT
SUT VIC AB 3-0 SH 27 (SUTURE)
SUT VIC AB 3-0 SH 27X BRD (SUTURE) IMPLANT
SUT VICRYL 4-0 PS2 18IN ABS (SUTURE) IMPLANT
SYR 3ML 23GX1 SAFETY (SYRINGE) IMPLANT
SYR 50ML LL SCALE MARK (SYRINGE) ×4 IMPLANT
SYR BULB IRRIG 60ML STRL (SYRINGE) ×4 IMPLANT
SYR CONTROL 10ML LL (SYRINGE) ×4 IMPLANT
SYR TB 1ML LL NO SAFETY (SYRINGE) ×4 IMPLANT
TAPE MEASURE VINYL STERILE (MISCELLANEOUS) ×4 IMPLANT
TOWEL GREEN STERILE FF (TOWEL DISPOSABLE) ×8 IMPLANT
TRAY DSU PREP LF (CUSTOM PROCEDURE TRAY) ×4 IMPLANT
TUBE CONNECTING 20X1/4 (TUBING) ×4 IMPLANT
TUBING INFILTRATION IT-10001 (TUBING) IMPLANT
TUBING SET GRADUATE ASPIR 12FT (MISCELLANEOUS) ×4 IMPLANT
UNDERPAD 30X36 HEAVY ABSORB (UNDERPADS AND DIAPERS) ×8 IMPLANT
YANKAUER SUCT BULB TIP NO VENT (SUCTIONS) ×4 IMPLANT

## 2020-05-08 NOTE — Anesthesia Preprocedure Evaluation (Addendum)
Anesthesia Evaluation  Patient identified by MRN, date of birth, ID band Patient awake    Reviewed: Allergy & Precautions, H&P , NPO status , Patient's Chart, lab work & pertinent test results  Airway Mallampati: I  TM Distance: >3 FB Neck ROM: Full    Dental no notable dental hx.    Pulmonary neg pulmonary ROS,    Pulmonary exam normal breath sounds clear to auscultation       Cardiovascular negative cardio ROS Normal cardiovascular exam Rhythm:Regular Rate:Normal     Neuro/Psych  Headaches, negative psych ROS   GI/Hepatic negative GI ROS, Neg liver ROS, GERD  Medicated and Controlled,  Endo/Other  negative endocrine ROS  Renal/GU negative Renal ROS  negative genitourinary   Musculoskeletal negative musculoskeletal ROS (+)   Abdominal   Peds negative pediatric ROS (+)  Hematology negative hematology ROS (+)   Anesthesia Other Findings   Reproductive/Obstetrics negative OB ROS                             Anesthesia Physical  Anesthesia Plan  ASA: II  Anesthesia Plan: General   Post-op Pain Management:    Induction: Intravenous  PONV Risk Score and Plan: 3 and Ondansetron, Dexamethasone, Midazolam and Treatment may vary due to age or medical condition  Airway Management Planned: Oral ETT  Additional Equipment:   Intra-op Plan:   Post-operative Plan: Extubation in OR  Informed Consent: I have reviewed the patients History and Physical, chart, labs and discussed the procedure including the risks, benefits and alternatives for the proposed anesthesia with the patient or authorized representative who has indicated his/her understanding and acceptance.     Dental advisory given  Plan Discussed with: CRNA and Surgeon  Anesthesia Plan Comments:         Anesthesia Quick Evaluation

## 2020-05-08 NOTE — Anesthesia Procedure Notes (Signed)
Procedure Name: LMA Insertion Date/Time: 05/08/2020 11:48 AM Performed by: Glory Buff, CRNA Pre-anesthesia Checklist: Patient identified, Emergency Drugs available, Suction available and Patient being monitored Patient Re-evaluated:Patient Re-evaluated prior to induction Oxygen Delivery Method: Circle system utilized Preoxygenation: Pre-oxygenation with 100% oxygen Induction Type: IV induction Ventilation: Mask ventilation without difficulty LMA: LMA inserted LMA Size: 4.0 Number of attempts: 1 Placement Confirmation: positive ETCO2 Tube secured with: Tape Dental Injury: Teeth and Oropharynx as per pre-operative assessment

## 2020-05-08 NOTE — Anesthesia Postprocedure Evaluation (Signed)
Anesthesia Post Note  Patient: Deanna Carter  Procedure(s) Performed: UNILATERAL BREAST REDUCTION (Left Breast) MASTOPEXY (Right Breast)     Patient location during evaluation: PACU Anesthesia Type: General Level of consciousness: awake and alert Pain management: pain level controlled Vital Signs Assessment: post-procedure vital signs reviewed and stable Respiratory status: spontaneous breathing, nonlabored ventilation and respiratory function stable Cardiovascular status: blood pressure returned to baseline and stable Postop Assessment: no apparent nausea or vomiting Anesthetic complications: no   No complications documented.  Last Vitals:  Vitals:   05/08/20 1415 05/08/20 1433  BP: 101/62 122/80  Pulse: 72 80  Resp: 17 16  Temp:  36.7 C  SpO2: 91% 95%    Last Pain:  Vitals:   05/08/20 1433  TempSrc:   PainSc: [place]

## 2020-05-08 NOTE — Interval H&P Note (Signed)
History and Physical Interval Note:  05/08/2020 11:17 AM  Deanna Carter  has presented today for surgery, with the diagnosis of Postoperative breast asymmetry, Symptomatic mammary hypertrophy.  The various methods of treatment have been discussed with the patient and family. After consideration of risks, benefits and other options for treatment, the patient has consented to  Procedure(s) with comments: UNILATERAL BREAST REDUCTION (Left) - 3 hours total, please MASTOPEXY (Right) POSSIBLE LIPOSUCTION (Bilateral) as a surgical intervention.  The patient's history has been reviewed, patient examined, no change in status, stable for surgery.  I have reviewed the patient's chart and labs.  Questions were answered to the patient's satisfaction.     Loel Lofty Kinzy Weyers

## 2020-05-08 NOTE — Discharge Instructions (Addendum)

## 2020-05-08 NOTE — Op Note (Signed)
Breast Reduction Op note:    DATE OF PROCEDURE: 05/08/2020  LOCATION: [place]  SURGEON: Lyndee Leo Sanger Rilla Buckman, DO  ASSISTANT: Roetta Sessions, PA  PREOPERATIVE DIAGNOSIS 1. Breast asymmetry after breast cancer treatment 2. Macromastia 3. Neck Pain / Back Pain  POSTOPERATIVE DIAGNOSIS 1. Breast asymmetry after breast cancer treatment 2. Macromastia 3. Neck Pain / Back Pain  PROCEDURES 1. Bilateral breast reduction.  Right reduction 60 g, Left reduction 601 g  COMPLICATIONS: None.  DRAINS: none  INDICATIONS FOR PROCEDURE Deanna Carter is a 54 y.o. year-old female born on 1966-05-04,with a history of symptomatic macromastia with concominant back pain, neck pain, shoulder grooving from her bra.   MRN: [id number]  CONSENT Informed consent was obtained directly from the patient. The risks, benefits and alternatives were fully discussed. Specific risks including but not limited to bleeding, infection, hematoma, seroma, scarring, pain, nipple necrosis, asymmetry, poor cosmetic results, and need for further surgery were discussed. The patient had ample opportunity to have her questions answered to her satisfaction.  DESCRIPTION OF PROCEDURE  Patient was brought into the operating room and placed in a supine position.  SCDs were placed and appropriate padding was performed.  Antibiotics were given. The patient underwent general anesthesia and the chest was prepped and draped in a sterile fashion.  A timeout was performed and all information was confirmed to be correct.  Right side: Preoperative markings were confirmed.  Incision lines were injected with 1% Xylocaine with epinephrine.  Both NAC were marked with the 45 mm circular device. After waiting for vasoconstriction, the marked lines were incised.  A Wise-pattern superomedial breast reduction was performed by de-epithelializing the pedicle, using bovie to create the superomedial pedicle, and  removing breast tissue from the superior, lateral, and inferior portions of the breast.  Care was taken to not undermine the breast pedicle. Hemostasis was achieved.  The nipple was gently rotated into position and the soft tissue closed with 4-0 Monocryl.   The pocket was irrigated and hemostasis confirmed.  The deep tissues were approximated with 3-0 PDS sutures and the skin was closed with deep dermal and subcuticular 3-0 and 4-0 Monocryl sutures.  The nipple and skin flaps had good capillary refill at the end of the procedure.    Left side: Preoperative markings were confirmed.  Incision lines were injected with 1% Xylocaine with epinephrine.  After waiting for vasoconstriction, the marked lines were incised.  A Wise-pattern mastopexy was performed by de-epithelializing the vertical limb and area around the NAC.  Care was taken to not undermine the breast pedicle. Hemostasis was achieved.  The nipple was gently lifted into position and the soft tissue was closed with 30- and 4-0 Monocryl.  The patient was sat upright and size and shape symmetry was confirmed.  The pocket was irrigated and hemostasis confirmed.  The deep tissues were approximated with 3-0 PDS sutures and the skin was closed with deep dermal and subcuticular 3-0 and 4-0 Monocryl sutures.  Dermabond was applied.  A breast binder and ABDs were placed.  The nipple and skin flaps had good capillary refill at the end of the procedure.  The patient tolerated the procedure well. The patient was allowed to wake from anesthesia and taken to the recovery room in satisfactory condition.  The advanced practice practitioner (APP) assisted throughout the case.  The APP was essential in retraction and counter traction when needed to make the case progress smoothly.  This retraction and assistance made it possible to  see the tissue plans for the procedure.  The assistance was needed for blood control, tissue re-approximation and assisted with closure of the  incision site.

## 2020-05-08 NOTE — Transfer of Care (Signed)
Immediate Anesthesia Transfer of Care Note  Patient: Deanna Carter  Procedure(s) Performed: UNILATERAL BREAST REDUCTION (Left Breast) MASTOPEXY (Right Breast)  Patient Location: PACU  Anesthesia Type:General  Level of Consciousness: drowsy, patient cooperative and responds to stimulation  Airway & Oxygen Therapy: Patient Spontanous Breathing and Patient connected to face mask oxygen  Post-op Assessment: Report given to RN and Post -op Vital signs reviewed and stable  Post vital signs: Reviewed and stable  Last Vitals:  Vitals Value Taken Time  BP 122/78 05/08/20 1333  Temp    Pulse 74 05/08/20 1335  Resp 11 05/08/20 1335  SpO2 100 % 05/08/20 1335  Vitals shown include unvalidated device data.  Last Pain:  Vitals:   05/08/20 1114  TempSrc: Oral  PainSc: 0-No pain         Complications: No complications documented.

## 2020-05-09 ENCOUNTER — Encounter (HOSPITAL_BASED_OUTPATIENT_CLINIC_OR_DEPARTMENT_OTHER): Payer: Self-pay | Admitting: Plastic Surgery

## 2020-05-09 LAB — SURGICAL PATHOLOGY

## 2020-05-13 ENCOUNTER — Encounter: Payer: Self-pay | Admitting: Physical Therapy

## 2020-05-14 ENCOUNTER — Telehealth: Payer: Self-pay | Admitting: Surgical

## 2020-05-14 NOTE — Telephone Encounter (Signed)
Spoke with patient, do not advise she lays directly on her stomach or put additional pressure on her breast.  She can sit bending forward if that is something the place can accommodate.

## 2020-05-14 NOTE — Telephone Encounter (Signed)
Patient is scheduled to have a dry needling session tomorrow morning and it will require her to be on her stomach for 45 minutes. Her surgery was on 12/22 so she wasn't sure if this was okay so soon after surgery. Please call her to advise.

## 2020-05-15 ENCOUNTER — Ambulatory Visit: Payer: Managed Care, Other (non HMO) | Admitting: Physical Therapy

## 2020-05-16 NOTE — Progress Notes (Signed)
Patient is a 54 year old female here for follow-up after left breast reduction and right breast mastopexy for symmetry on 05/08/2020 with Dr. Ulice Bold.  Patient reports she is overall doing well.  She reports that she has been wearing breast binder at night and another garment during the day.  She reports that she has not needed to take any of the narcotics for pain control.  She has not needed any medication today for pain.    Chaperone present on exam On exam bilateral NAC's are viable, good color, good capillary refill.  Bilateral vertical limb incisions appear intact, they are covered by Steri-Strips and honeycomb dressings.  There is no drainage noted on either of dressings.  She does have some swelling, as expected.  No fluid wave noted with palpation.  Mild bruising noted of the right medial breast, no other bruising noted.  Discussed with patient the current compression garment she is wearing today is not providing enough support/compression, recommend purchasing a sports bra with more compression that clips in the front.  Recommend avoiding strenuous activity.  Recommend following up in 2 weeks for reevaluation, she already has this appointment scheduled.  There is no sign of infection, seroma, hematoma.  We discussed some options for scar creams to start using in a few weeks.  We also discussed the use of her immunomodulators causing delayed healing, she reports that she has thought about this and may hold off on restarting it for another few weeks or even longer.

## 2020-05-17 ENCOUNTER — Encounter: Payer: Self-pay | Admitting: Surgical

## 2020-05-17 ENCOUNTER — Other Ambulatory Visit: Payer: Self-pay

## 2020-05-17 ENCOUNTER — Ambulatory Visit (INDEPENDENT_AMBULATORY_CARE_PROVIDER_SITE_OTHER): Payer: Managed Care, Other (non HMO) | Admitting: Surgical

## 2020-05-17 VITALS — BP 124/81 | HR 71 | Temp 97.8°F

## 2020-05-17 DIAGNOSIS — N6489 Other specified disorders of breast: Secondary | ICD-10-CM

## 2020-05-17 DIAGNOSIS — Z9889 Other specified postprocedural states: Secondary | ICD-10-CM

## 2020-05-31 ENCOUNTER — Ambulatory Visit (INDEPENDENT_AMBULATORY_CARE_PROVIDER_SITE_OTHER): Payer: BC Managed Care – PPO | Admitting: Plastic Surgery

## 2020-05-31 ENCOUNTER — Other Ambulatory Visit: Payer: Self-pay

## 2020-05-31 ENCOUNTER — Encounter: Payer: Self-pay | Admitting: Plastic Surgery

## 2020-05-31 VITALS — BP 135/85 | HR 64

## 2020-05-31 DIAGNOSIS — N62 Hypertrophy of breast: Secondary | ICD-10-CM

## 2020-05-31 NOTE — Progress Notes (Signed)
   Subjective:    Patient ID: Deanna Carter, female    DOB: [birth year], 55 y.o.   MRN: [id number]  The patient is a 55 years old female here for follow-up on her breast reduction.  She is doing well.  Does not have any persistent pain.  She had a little bit of pain when she was removing some Christmas ornaments from the porch and was on a ladder.  That seems to have settled down.  She still has swelling as expected.  No sign of seroma or hematoma.  Incisions are intact and the Steri-Strips are still in place.  Today she also asked about some fullness in her submental area.     Review of Systems  Constitutional: Positive for activity change.  Eyes: Negative.   Respiratory: Negative.   Cardiovascular: Negative.   Gastrointestinal: Negative.        Objective:   Physical Exam Vitals and nursing note reviewed.  Constitutional:      Appearance: Normal appearance.  Cardiovascular:     Rate and Rhythm: Normal rate.     Pulses: Normal pulses.  Neurological:     General: No focal deficit present.     Mental Status: She is alert. Mental status is at baseline.  Psychiatric:        Mood and Affect: Mood normal.        Behavior: Behavior normal.         Assessment & Plan:     ICD-10-CM   1. Symptomatic mammary hypertrophy  N62     Kybella is an option for the patient.  She was given some information and a quote.  I would like to see her for her breasts in the next 2 months.  We will also release her a vein in her leg at that time.  She knows to remind [place].

## 2020-06-14 ENCOUNTER — Telehealth: Payer: Self-pay

## 2020-06-14 NOTE — Telephone Encounter (Signed)
Patient called to say that she had breast reduction surgery on 05/08/2020 with Dr. Marla Roe and all of the steri-strips have come off.  Patient said that her next appointment is scheduled for 07/02/2020 and she is wondering if she needs to come in sooner.  Please call.

## 2020-06-14 NOTE — Telephone Encounter (Signed)
Returned patients call. She is not having any problems with the incisions. Advise her there was no need to come in sooner than her next scheduled appointment. She is interested in using scar cream, either Mederma or Skinuva. Suggested to wait and see Dr. Marla Roe on 07/02/2020 to observe how the incision are healing properly  and if she start using the scar cream.  Patient understood and agreed.

## 2020-06-30 DIAGNOSIS — Z9889 Other specified postprocedural states: Secondary | ICD-10-CM | POA: Insufficient documentation

## 2020-06-30 NOTE — Progress Notes (Deleted)
Patient is a 55 year old female here for follow-up after undergoing left breast reduction and right breast mastopexy for symmetry on 05/08/2020 with Dr. Marla Roe.  ~ 8 weeks PO

## 2020-07-01 ENCOUNTER — Other Ambulatory Visit: Payer: Self-pay

## 2020-07-01 ENCOUNTER — Ambulatory Visit (INDEPENDENT_AMBULATORY_CARE_PROVIDER_SITE_OTHER): Payer: Self-pay | Admitting: Plastic Surgery

## 2020-07-01 ENCOUNTER — Encounter: Payer: Self-pay | Admitting: Plastic Surgery

## 2020-07-01 ENCOUNTER — Encounter: Payer: BC Managed Care – PPO | Admitting: Plastic Surgery

## 2020-07-01 DIAGNOSIS — Z719 Counseling, unspecified: Secondary | ICD-10-CM | POA: Insufficient documentation

## 2020-07-01 NOTE — Progress Notes (Signed)
Kybella Procedure Note  Procedure: Cosmetic deoxycholic acid injection (2 vials were used)  Pre-operative Diagnosis: Submental fullness  Post-operative Diagnosis: Same  Complications:  None  Brief history: The patient desires improvement in the appearance of moderate to severe convexity or fullness associated with submental fat. I discussed with the patient this proposed procedure of Kybella, which is customized depending on the particular needs of the patient. It is performed on the submental area for reduction in the fat.  The alternatives were discussed with the patient. The risks were addressed including bleeding, scarring, infection, damage to deeper structures, asymmetry, numbness, formation of areas of hardness, swelling, nodules, skin ulceration, headache, alopecia, difficulty swallowing, and muscle weakness. Additionally, marginal mandibular nerve injury could occur and is manifested as an asymmetric smile or facial muscle weakness.  The individual's choice to undergo a surgical procedure is based on the comparison of risks to potential benefits. Injections do not arrest the aging process or produce permanent tightening of the skin.  Operative intervention maybe necessary to maintain the results. The patient understands and wishes to proceed. An informed consent was signed and informational brochures given to her prior to the procedure.  Procedure: The area was prepped with alcohol and dried with a clean gauze. Using a clean technique, the submental area was palpated and pinched.  The skin at the area was pulled and there was appropriate elasticity noted without excessive skin laxity. The patient was asked to tense the platysma to define the subcutaneous fat between the dermis and platysma.  A skin marker was used to mark the anterior, posterior and lateral borders of the submental fat compartment. The "no treatment Zone" was marked. The treatment zone was injected in the subcutaneous layer with  lidocaine 1% with epinepherine. The grid was placed on the skin with the water over it for activation of the ink.  The clear protective sheet was removed leaving the markings.  The dots outside the previously marked treatment zone were removed with an alcohol wipe.  The pre-platysmal fat was pinched with 2 fingers.  Each dot was injected perpendicular to the skin with .2 cc of Kybella using a 1 ml syring and a 30 gauge needle. The ink was wiped off the skin with an alcohol wipe and a cold pack was applied to the treatment area. No complications were noted. Light pressure with an ice pack was held for 5 minutes. She was instructed explicitly in post-operative care including no massage, heavy activity, work out or facial for 24 hours.  Deoxycholic Acid LOT:  [id number] F

## 2020-07-02 ENCOUNTER — Ambulatory Visit: Payer: BC Managed Care – PPO | Admitting: Plastic Surgery

## 2020-07-02 DIAGNOSIS — Z9889 Other specified postprocedural states: Secondary | ICD-10-CM

## 2020-07-05 ENCOUNTER — Ambulatory Visit: Payer: BC Managed Care – PPO | Admitting: Plastic Surgery

## 2020-07-08 ENCOUNTER — Telehealth: Payer: Self-pay | Admitting: Plastic Surgery

## 2020-07-08 NOTE — Telephone Encounter (Signed)
Patient had kybella injections on 02/14/ 2022 and still has quite a bit of bruising. She would like to know if it's okay to use arnica cream for that. Please call her to advise at [phone]. She said it is okay to leave on her vm.

## 2020-07-09 ENCOUNTER — Telehealth: Payer: Self-pay

## 2020-07-09 NOTE — Telephone Encounter (Signed)
Call returned to Deanna Carter regarding her request to use Arnica gel on her neck bruising following her procedure on 07/01/20 I consulted with Dr. Marla Roe & she approves the use of arnica gel at this time Deanna Carter reports that she still has a significant amount of bruising - but the swelling continues to resolve more each day. She has minimal pain & no difficulty with breathing/ swallowing/eating or drinking. Dr. Marla Roe had instructed the Deanna Carter to do "massage" to both of her breast for scar/firmness noted on palpation- she is doing this daily. She also wanted information about Willeen Niece- for external scars- she indicated that she may purchase this from our office. Deanna Carter has a f/u-"televisit" with Dr. Marla Roe on 07/23/20 She is reminded to call for any concerns.

## 2020-07-23 ENCOUNTER — Ambulatory Visit (INDEPENDENT_AMBULATORY_CARE_PROVIDER_SITE_OTHER): Payer: BC Managed Care – PPO | Admitting: Plastic Surgery

## 2020-07-23 ENCOUNTER — Telehealth: Payer: BC Managed Care – PPO | Admitting: Plastic Surgery

## 2020-07-23 ENCOUNTER — Encounter: Payer: Self-pay | Admitting: Plastic Surgery

## 2020-07-23 ENCOUNTER — Other Ambulatory Visit: Payer: Self-pay

## 2020-07-23 VITALS — BP 130/81 | HR 73

## 2020-07-23 DIAGNOSIS — Z719 Counseling, unspecified: Secondary | ICD-10-CM

## 2020-07-23 NOTE — Progress Notes (Signed)
The patient is a 55 year old female here for follow-up on her overall nicely.  There is no sign of infection.  She has some swelling as expected.  And some bruising.  She also asked me to take a look at her right breast vertical limb.  It feels like there is a little bit of a PDS suture sticking into her skin.  I have encouraged her to massage this.  If it does not go away we can certainly remove it.  She is willing to give it a little bit more time.  She think she would probably like to do the second Kybella treatment in the fall.  That is perfectly fine.  She knows to call me if she has any questions or concerns.  Pictures were obtained of the patient and placed in the chart with the patient's or guardian's permission.

## 2020-08-02 ENCOUNTER — Encounter: Payer: BC Managed Care – PPO | Admitting: Plastic Surgery

## 2021-05-19 ENCOUNTER — Other Ambulatory Visit (HOSPITAL_COMMUNITY)
Admission: RE | Admit: 2021-05-19 | Discharge: 2021-05-19 | Disposition: A | Discharge status: Home / Self Care | Payer: BC Managed Care – PPO | Source: Ambulatory Visit | Attending: Family Medicine | Admitting: Family Medicine

## 2021-05-19 ENCOUNTER — Other Ambulatory Visit: Payer: Self-pay

## 2021-05-19 ENCOUNTER — Emergency Department (INDEPENDENT_AMBULATORY_CARE_PROVIDER_SITE_OTHER)
Admission: EM | Admit: 2021-05-19 | Discharge: 2021-05-19 | Disposition: A | Discharge status: Home / Self Care | Payer: BC Managed Care – PPO | Source: Home / Self Care | Attending: Family Medicine | Admitting: Family Medicine

## 2021-05-19 ENCOUNTER — Emergency Department: Admit: 2021-05-19 | Discharge status: Absent without leave | Payer: Self-pay

## 2021-05-19 DIAGNOSIS — R350 Frequency of micturition: Secondary | ICD-10-CM

## 2021-05-19 LAB — POCT URINALYSIS DIP (MANUAL ENTRY)
Bilirubin, UA: NEGATIVE
Glucose, UA: NEGATIVE mg/dL
Ketones, POC UA: NEGATIVE mg/dL
Nitrite, UA: NEGATIVE
Protein Ur, POC: NEGATIVE mg/dL
Spec Grav, UA: 1.015 (ref 1.010–1.025)
Urobilinogen, UA: 0.2 E.U./dL
pH, UA: 7.5 (ref 5.0–8.0)

## 2021-05-19 MED ORDER — SULFAMETHOXAZOLE-TRIMETHOPRIM 800-160 MG PO TABS: 1.0000 | ORAL_TABLET | Freq: Two times a day (BID) | ORAL | 0 refills | Status: AC

## 2021-05-19 NOTE — ED Triage Notes (Signed)
Pt presents with back pain, dysuria, and urinary frequency that began today

## 2021-05-19 NOTE — ED Provider Notes (Signed)
[facility]    CSN: [id number] Arrival date & time: 05/19/21  1326      History   Chief Complaint Chief Complaint  Patient presents with   Urinary Frequency   Back Pain   Dysuria    HPI Deanna Carter is a 56 y.o. female.   HPI  Patient is here for urinary frequency.  Burning.  Started today.  No fever or chills.  No body aches.  No flank pain, nausea or vomiting.  No history of kidney stones or kidney problems Past Medical History:  Diagnosis Date   Generalized headaches    tension - only takes medication as needed to treat   GERD (gastroesophageal reflux disease)    H/O hiatal hernia     Patient Active Problem List   Diagnosis Date Noted   Encounter for counseling 07/01/2020   S/P bilateral breast reduction 06/30/2020   Postoperative breast asymmetry 03/15/2020   Neck pain 03/15/2020   Back pain 03/15/2020   Symptomatic mammary hypertrophy 03/15/2020   Migraine 12/01/2012   Breast mass, right 03/02/2011    Past Surgical History:  Procedure Laterality Date   BLADDER SUSPENSION  2005, 2009   BREAST LUMPECTOMY  2012   BREAST LUMPECTOMY Right 11/02/2013   Procedure: RIGHT BREAST LUMPECTOMY;  Surgeon: Harl Bowie, MD;  Location: [place];  Service: General;  Laterality: Right;   BREAST LUMPECTOMY Right 04/20/2014   Procedure: RIGHT BREAST LUMPECTOMY;  Surgeon: Coralie Keens, MD;  Location: [place];  Service: General;  Laterality: Right;   BREAST SURGERY  03/26/2011   Right Breast Mass   EYE SURGERY Bilateral 1999   lasik   MASTECTOMY, PARTIAL  03/26/2011   MASTOPEXY Right 05/08/2020   Procedure: MASTOPEXY;  Surgeon: Wallace Going, DO;  Location: [place];  Service: Plastics;  Laterality: Right;   TUBAL LIGATION  2005   UNILATERAL BREAST REDUCTION Left 05/08/2020   Procedure: UNILATERAL BREAST REDUCTION;  Surgeon: Wallace Going, DO;  Location: [place];  Service: Plastics;   Laterality: Left;  3 hours total, please    OB History   No obstetric history on file.      Home Medications    Prior to Admission medications   Medication Sig Start Date End Date Taking? Authorizing Provider  sulfamethoxazole-trimethoprim (BACTRIM DS) 800-160 MG tablet Take 1 tablet by mouth 2 (two) times daily for 5 days. 05/19/21 05/24/21 Yes Raylene Everts, MD  acetaminophen (TYLENOL) 500 MG tablet Take 1,000 mg by mouth daily as needed for headache. For pain    [provider]  ACZONE 5 % topical gel Apply 1 application topically 2 (two) times daily. For face 10/11/12   [provider]  Ascorbic Acid (VITAMIN C PO) Take 1 tablet by mouth 2 (two) times daily.    [provider]  azelastine (ASTELIN) 0.1 % nasal spray Place 2 sprays into the nose.  01/05/14   [provider]  calcium carbonate (OS-CAL) 600 MG TABS tablet Take 600 mg by mouth 2 (two) times daily with a meal.    [provider]  cycloSPORINE (RESTASIS) 0.05 % ophthalmic emulsion Place 1 drop into both eyes daily.     [provider]  diazepam (VALIUM) 5 MG tablet Take 5 mg by mouth as needed. 09/28/13   [provider]  ibuprofen (ADVIL,MOTRIN) 200 MG tablet Take 600 mg by mouth daily as needed (for headaches). For pain    [provider]  loratadine (CLARITIN) 10 MG tablet Take 10 mg by mouth daily. Patient not taking: Reported on 05/19/2021    [provider]  olopatadine (PATANOL) 0.1 % ophthalmic solution Place 1 drop into both eyes 2 (two) times daily.     [provider]  OVER THE COUNTER MEDICATION Take 1 packet by mouth daily as needed (for headaches). Goodies Powder as needed for pain    [provider]  Probiotic Product (PROBIOTIC DAILY PO) Take 1 tablet by mouth daily.    [provider]  SUMAtriptan (IMITREX) 100 MG tablet Take 1 tablet (100 mg total) by mouth 2 (two) times a week. No more than 2 in 24 hour  period 11/06/15   Dennie Bible, NP  traZODone (DESYREL) 100 MG tablet Take 100 mg by mouth at bedtime.    [provider]  Upadacitinib (RINVOQ PO) Take by mouth.    [provider]  VITAMIN D, CHOLECALCIFEROL, PO Take 1 tablet by mouth daily.    [provider]    Family History History reviewed. No pertinent family history.  Social History Social History   Tobacco Use   Smoking status: Never   Smokeless tobacco: Never  Substance Use Topics   Alcohol use: Yes    Comment: rarely   Drug use: No     Allergies   Gabapentin   Review of Systems Review of Systems See HPI  Physical Exam Triage Vital Signs ED Triage Vitals  Enc Vitals Group     BP 05/19/21 1435 (!) 153/92     Pulse Rate 05/19/21 1435 66     Resp 05/19/21 1435 14     Temp 05/19/21 1435 98.3 F (36.8 C)     Temp Source 05/19/21 1435 Oral     SpO2 05/19/21 1435 98 %     Weight --      Height --      Head Circumference --      Peak Flow --      Pain Score 05/19/21 1436 4     Pain Loc --      Pain Edu? --      Excl. in [place]? --    No data found.  Updated Vital Signs BP (!) 153/92 (BP Location: Left Arm)    Pulse 66    Temp 98.3 F (36.8 C) (Oral)    Resp 14    LMP 07/02/2016    SpO2 98%      Physical Exam Constitutional:      General: She is not in acute distress.    Appearance: She is well-developed.  HENT:     Head: Normocephalic and atraumatic.  Eyes:     Conjunctiva/sclera: Conjunctivae normal.     Pupils: Pupils are equal, round, and reactive to light.  Cardiovascular:     Rate and Rhythm: Normal rate.  Pulmonary:     Effort: Pulmonary effort is normal. No respiratory distress.  Abdominal:     General: There is no distension.     Palpations: Abdomen is soft.     Comments: Denies flank pain  Musculoskeletal:        General: Normal range of motion.     Cervical back: Normal range of motion.  Skin:    General: Skin is warm and dry.  Neurological:      Mental Status: She is alert.     UC Treatments / Results  Labs (all labs ordered are listed, but only abnormal results  are displayed) Labs Reviewed  POCT URINALYSIS DIP (MANUAL ENTRY) - Abnormal; Notable for the following components:      Result Value   Clarity, UA cloudy (*)    Blood, UA trace-intact (*)    Leukocytes, UA Large (3+) (*)    All other components within normal limits  URINE CULTURE    EKG   Radiology No results found.  Procedures Procedures (including critical care time)  Medications Ordered in UC Medications - No data to display  Initial Impression / Assessment and Plan / UC Course  I have reviewed the triage vital signs and the nursing notes.  Pertinent labs & imaging results that were available during my care of the patient were reviewed by me and considered in my medical decision making (see chart for details).     Final Clinical Impressions(s) / UC Diagnoses   Final diagnoses:  Urinary frequency     Discharge Instructions      Drink lots of water Take the sulfa antibiotic 2 times a day Your urine culture report will be available on MyChart in 2 to 3 days.  You will be called if any change in antibiotic is needed    ED Prescriptions     Medication Sig Dispense Auth. Provider   sulfamethoxazole-trimethoprim (BACTRIM DS) 800-160 MG tablet Take 1 tablet by mouth 2 (two) times daily for 5 days. 10 tablet Raylene Everts, MD      PDMP not reviewed this encounter.   Raylene Everts, MD 05/19/21 1520

## 2021-05-19 NOTE — Discharge Instructions (Addendum)
Drink lots of water Take the sulfa antibiotic 2 times a day Your urine culture report will be available on MyChart in 2 to 3 days.  You will be called if any change in antibiotic is needed

## 2021-05-20 ENCOUNTER — Encounter: Payer: BC Managed Care – PPO | Admitting: Plastic Surgery

## 2021-05-22 LAB — URINE CULTURE: Culture: 60000 — AB

## 2021-06-13 ENCOUNTER — Encounter: Payer: Self-pay | Admitting: Plastic Surgery

## 2021-06-13 ENCOUNTER — Ambulatory Visit (INDEPENDENT_AMBULATORY_CARE_PROVIDER_SITE_OTHER): Payer: BC Managed Care – PPO | Admitting: Plastic Surgery

## 2021-06-13 ENCOUNTER — Other Ambulatory Visit: Payer: Self-pay

## 2021-06-13 DIAGNOSIS — Z719 Counseling, unspecified: Secondary | ICD-10-CM

## 2021-06-13 NOTE — Progress Notes (Signed)
Kybella Procedure Note  Procedure: Cosmetic deoxycholic acid injection (2 vials were used)  Pre-operative Diagnosis: Submental fullness  Post-operative Diagnosis: Same  Complications:  None  Brief history: The patient desires improvement in the appearance of moderate to severe convexity or fullness associated with submental fat. I discussed with the patient this proposed procedure of Kybella, which is customized depending on the particular needs of the patient. It is performed on the submental area for reduction in the fat.  The alternatives were discussed with the patient. The risks were addressed including bleeding, scarring, infection, damage to deeper structures, asymmetry, numbness, formation of areas of hardness, swelling, nodules, skin ulceration, headache, alopecia, difficulty swallowing, and muscle weakness. Additionally, marginal mandibular nerve injury could occur and is manifested as an asymmetric smile or facial muscle weakness.  The individual's choice to undergo a surgical procedure is based on the comparison of risks to potential benefits. Injections do not arrest the aging process or produce permanent tightening of the skin.  Operative intervention maybe necessary to maintain the results. The patient understands and wishes to proceed. An informed consent was signed and informational brochures given to her prior to the procedure.  Procedure: The area was prepped with alcohol and dried with a clean gauze. Using a clean technique, the submental area was palpated and pinched.  The skin at the area was pulled and there was appropriate elasticity noted without excessive skin laxity. The patient was asked to tense the platysma to define the subcutaneous fat between the dermis and platysma.  A skin marker was used to mark the anterior, posterior and lateral borders of the submental fat compartment. The "no treatment Zone" was marked. The treatment zone was injected in the subcutaneous layer with  lidocaine 1% with epinepherine. The grid was placed on the skin with the water over it for activation of the ink.  The clear protective sheet was removed leaving the markings.  The dots outside the previously marked treatment zone were removed with an alcohol wipe.  The pre-platysmal fat was pinched with 2 fingers.  Each dot was injected perpendicular to the skin with .2 cc of Kybella using a 1 ml syring and a 30 gauge needle. The ink was wiped off the skin with an alcohol wipe and a cold pack was applied to the treatment area. No complications were noted. Light pressure with an ice pack was held for 5 minutes. She was instructed explicitly in post-operative care including no massage, heavy activity, work out or facial for 24 hours.  Deoxycholic Acid LOT:  [id number] D EXP:  07/2022

## 2021-12-26 ENCOUNTER — Encounter: Payer: Self-pay | Admitting: Plastic Surgery
